# Patient Record
Sex: Male | Born: 1945 | Race: White | Hispanic: No | Marital: Married | Smoking: Former smoker
Health system: Southern US, Community
[De-identification: ages and names within clinical notes are randomized; demographics above are authoritative.]

## PROBLEM LIST (undated history)

## (undated) DIAGNOSIS — J449 Chronic obstructive pulmonary disease, unspecified: Secondary | ICD-10-CM

## (undated) DIAGNOSIS — J309 Allergic rhinitis, unspecified: Secondary | ICD-10-CM

## (undated) DIAGNOSIS — R7302 Impaired glucose tolerance (oral): Secondary | ICD-10-CM

## (undated) DIAGNOSIS — J438 Other emphysema: Secondary | ICD-10-CM

## (undated) DIAGNOSIS — K219 Gastro-esophageal reflux disease without esophagitis: Secondary | ICD-10-CM

## (undated) DIAGNOSIS — E785 Hyperlipidemia, unspecified: Secondary | ICD-10-CM

## (undated) DIAGNOSIS — I2510 Atherosclerotic heart disease of native coronary artery without angina pectoris: Secondary | ICD-10-CM

## (undated) DIAGNOSIS — S2249XA Multiple fractures of ribs, unspecified side, initial encounter for closed fracture: Secondary | ICD-10-CM

## (undated) HISTORY — DX: Other emphysema: J43.8

## (undated) HISTORY — DX: Multiple fractures of ribs, unspecified side, initial encounter for closed fracture: S22.49XA

## (undated) HISTORY — PX: APPENDECTOMY: SHX54

## (undated) HISTORY — DX: Allergic rhinitis, unspecified: J30.9

## (undated) HISTORY — DX: Chronic obstructive pulmonary disease, unspecified: J44.9

## (undated) HISTORY — DX: Gastro-esophageal reflux disease without esophagitis: K21.9

## (undated) HISTORY — DX: Hyperlipidemia, unspecified: E78.5

## (undated) HISTORY — DX: Impaired glucose tolerance (oral): R73.02

## (undated) HISTORY — DX: Atherosclerotic heart disease of native coronary artery without angina pectoris: I25.10

## (undated) HISTORY — PX: HEMORRHOID SURGERY: SHX153

---

## 1999-08-06 ENCOUNTER — Encounter: Payer: Self-pay | Admitting: Internal Medicine

## 1999-08-06 ENCOUNTER — Ambulatory Visit (HOSPITAL_COMMUNITY): Admission: RE | Admit: 1999-08-06 | Discharge: 1999-08-06 | Payer: Self-pay | Admitting: Internal Medicine

## 1999-08-17 ENCOUNTER — Ambulatory Visit (HOSPITAL_COMMUNITY): Admission: RE | Admit: 1999-08-17 | Discharge: 1999-08-17 | Payer: Self-pay | Admitting: *Deleted

## 1999-09-10 ENCOUNTER — Ambulatory Visit: Admission: RE | Admit: 1999-09-10 | Discharge: 1999-09-10 | Payer: Self-pay | Admitting: Internal Medicine

## 2000-02-17 DIAGNOSIS — S2249XA Multiple fractures of ribs, unspecified side, initial encounter for closed fracture: Secondary | ICD-10-CM

## 2000-02-17 HISTORY — DX: Multiple fractures of ribs, unspecified side, initial encounter for closed fracture: S22.49XA

## 2002-01-04 ENCOUNTER — Ambulatory Visit: Admission: RE | Admit: 2002-01-04 | Discharge: 2002-01-04 | Payer: Self-pay | Admitting: Pulmonary Disease

## 2002-01-18 ENCOUNTER — Ambulatory Visit: Admission: RE | Admit: 2002-01-18 | Discharge: 2002-01-18 | Payer: Self-pay | Admitting: Pulmonary Disease

## 2005-02-17 ENCOUNTER — Ambulatory Visit: Payer: Self-pay | Admitting: Pulmonary Disease

## 2005-03-24 ENCOUNTER — Ambulatory Visit: Payer: Self-pay | Admitting: Pulmonary Disease

## 2005-06-20 ENCOUNTER — Ambulatory Visit: Payer: Self-pay | Admitting: Pulmonary Disease

## 2005-06-27 ENCOUNTER — Ambulatory Visit: Payer: Self-pay | Admitting: Pulmonary Disease

## 2005-10-31 ENCOUNTER — Ambulatory Visit: Payer: Self-pay | Admitting: Pulmonary Disease

## 2005-11-07 ENCOUNTER — Ambulatory Visit: Payer: Self-pay | Admitting: Pulmonary Disease

## 2006-11-06 ENCOUNTER — Ambulatory Visit: Payer: Self-pay | Admitting: Pulmonary Disease

## 2007-03-16 ENCOUNTER — Ambulatory Visit: Payer: Self-pay | Admitting: Internal Medicine

## 2007-09-14 ENCOUNTER — Ambulatory Visit: Payer: Self-pay | Admitting: Internal Medicine

## 2007-10-29 DIAGNOSIS — J449 Chronic obstructive pulmonary disease, unspecified: Secondary | ICD-10-CM

## 2007-10-29 DIAGNOSIS — J438 Other emphysema: Secondary | ICD-10-CM

## 2007-10-29 HISTORY — DX: Other emphysema: J43.8

## 2008-04-09 ENCOUNTER — Ambulatory Visit: Payer: Self-pay | Admitting: Internal Medicine

## 2008-04-09 DIAGNOSIS — J209 Acute bronchitis, unspecified: Secondary | ICD-10-CM

## 2008-05-20 ENCOUNTER — Encounter: Payer: Self-pay | Admitting: Internal Medicine

## 2008-05-26 ENCOUNTER — Ambulatory Visit: Payer: Self-pay | Admitting: Internal Medicine

## 2008-05-26 DIAGNOSIS — J309 Allergic rhinitis, unspecified: Secondary | ICD-10-CM

## 2008-05-26 DIAGNOSIS — R29810 Facial weakness: Secondary | ICD-10-CM | POA: Insufficient documentation

## 2008-05-26 DIAGNOSIS — K219 Gastro-esophageal reflux disease without esophagitis: Secondary | ICD-10-CM | POA: Insufficient documentation

## 2008-05-26 DIAGNOSIS — I2510 Atherosclerotic heart disease of native coronary artery without angina pectoris: Secondary | ICD-10-CM | POA: Insufficient documentation

## 2008-05-26 HISTORY — DX: Atherosclerotic heart disease of native coronary artery without angina pectoris: I25.10

## 2008-05-26 HISTORY — DX: Allergic rhinitis, unspecified: J30.9

## 2008-05-26 HISTORY — DX: Gastro-esophageal reflux disease without esophagitis: K21.9

## 2008-05-26 LAB — CONVERTED CEMR LAB: PSA: 0.94 ng/mL (ref 0.10–4.00)

## 2008-05-28 ENCOUNTER — Telehealth (INDEPENDENT_AMBULATORY_CARE_PROVIDER_SITE_OTHER): Payer: Self-pay | Admitting: *Deleted

## 2008-06-03 ENCOUNTER — Encounter: Admission: RE | Admit: 2008-06-03 | Discharge: 2008-06-03 | Payer: Self-pay | Admitting: Internal Medicine

## 2008-06-24 ENCOUNTER — Telehealth (INDEPENDENT_AMBULATORY_CARE_PROVIDER_SITE_OTHER): Payer: Self-pay | Admitting: *Deleted

## 2008-06-24 ENCOUNTER — Ambulatory Visit: Payer: Self-pay | Admitting: Endocrinology

## 2008-06-24 DIAGNOSIS — R05 Cough: Secondary | ICD-10-CM

## 2008-12-17 ENCOUNTER — Ambulatory Visit: Payer: Self-pay | Admitting: Internal Medicine

## 2009-01-15 ENCOUNTER — Telehealth (INDEPENDENT_AMBULATORY_CARE_PROVIDER_SITE_OTHER): Payer: Self-pay | Admitting: *Deleted

## 2009-04-16 ENCOUNTER — Ambulatory Visit: Payer: Self-pay | Admitting: Internal Medicine

## 2009-06-29 ENCOUNTER — Ambulatory Visit: Payer: Self-pay | Admitting: Internal Medicine

## 2009-08-10 ENCOUNTER — Ambulatory Visit: Payer: Self-pay | Admitting: Internal Medicine

## 2010-07-29 ENCOUNTER — Ambulatory Visit: Payer: Self-pay | Admitting: Internal Medicine

## 2011-01-18 NOTE — Assessment & Plan Note (Signed)
Summary: Pumonary/ ext yearly f/u ov with HFA 90% p coaching   Copy to:  Rubye Oaks NP Primary Provider/Referring Provider:  Dr. Jonny Ruiz  CC:  Followup.  Pt states that overall breathing well.  Has had some trouble with the hot weather- tried to stay indoors.  He states throat has felt scratchy x 1 wk- had prod cough this am with clear sputum.Marland Kitchen  History of Present Illness: 89   yowm quit smoking 1992  with COPD FEV1 42% 03/24/05  December 17, 2008--presents for an acute office visit. Complains of 4 weeks of waxing and waning  days of nasal congestion, sore throat, drainage, cough, thick yellow-green mucus, and dyspnea. symptoms worsened 1 week ago. using mucinex.  rx with augmentin and prednisone  April 16, 2009 ov  back to baseline after exac 12/09 = doe chicken pens to house has to stop half way most improvement from advair, none from spiriva and has combivent but not using. no cough, am or otherwise. rec stop spiriva  June 29, 2009 ov with pft's no improvment doe and very hoarse, lots of throat clearing, no excess mucus imp was upper airway instability ? advair irriating   rec Stop advair Start Symbicort 160 2 puffs first thing  in am and 2 puffs again in pm about 12 hours later   August 10, 2009 no real change but noted today could go from pen to house without stopping, no nocturnal or am exac.  rec work on technique and add  acid rx if hoarsenss  July 29, 2010 Followup.  Pt states that overall breathing well.  Has had some trouble with the hot weather- tried to stay indoors.  He states throat has felt scratchy x 1 wk- had prod cough this am with clear sputum. no limiting sob.  Pt denies any significant sore throat, dysphagia, itching, sneezing,  nasal congestion or excess secretions,  fever, chills, sweats, unintended wt loss, pleuritic or exertional cp, hempoptysis, change in activity tolerance  orthopnea pnd or leg swelling. Pt also denies any obvious fluctuation in symptoms with weather  or environmental change or other alleviating or aggravating factors.          Current Medications (verified): 1)  Symbicort 160-4.5 Mcg/act  Aero (Budesonide-Formoterol Fumarate) .... 2 Puffs First Thing  in Am and 2 Puffs Again in Pm About 12 Hours Later 2)  Adult Aspirin Low Strength 81 Mg  Tbdp (Aspirin) .... Take 1 Tablet By Mouth Once A Day 3)  Multivitamins   Tabs (Multiple Vitamin) .... Take 1 Tablet By Mouth Once A Day 4)  B Complex 100  Tabs (B Complex Vitamins) .... Take 1 Tablet By Mouth Once A Day 5)  Bee Pollen 500 Mg Tabs (Bee Pollen) .Marland Kitchen.. 1 Once Daily 6)  Pepcid Ac 10 Mg  Tabs (Famotidine) .... Take 1 Tablet By Mouth Once Daily As Needed 7)  Mucinex 600 Mg Xr12h-Tab (Guaifenesin) .Marland Kitchen.. 1-2 Tablets Every 12 Hours As Needed 8)  Combivent 103-18 Mcg/act  Aero (Ipratropium-Albuterol) .... 2  Puffs Every 4 Hours As Needed 9)  Aleve 220 Mg Tabs (Naproxen Sodium) .... As Needed 10)  Zyrtec Allergy 10 Mg Tabs (Cetirizine Hcl) .Marland Kitchen.. 1 Once Daily As Needed  Allergies (verified): 1)  ! * Decongestants  Past History:  Past Medical History: EMPHYSEMA (ICD-492.8) CHRONIC OBSTRUCTIVE PULMONARY DISEASE, MODERATE (ICD-496)    - PFT's 03/24/05 FEV1 1.37 ratio 42,   21% improvment in FVC after B2, DLC0 80    - PFT's 06/29/09  1.19 ratio 29    6 % better after B2,   DLC0 73    - HFA 75% June 29, 2009 > 90 % August 10, 2009  GERD Coronary artery disease - minor by cath 2000 Allergic rhinitis Multiple rib fxs 3/20011  Vital Signs:  Patient profile:   65 year old male Weight:      185.13 pounds BMI:     26.66 O2 Sat:      93 % on Room air Temp:     98.0 degrees F oral Pulse rate:   78 / minute BP sitting:   122 / 80  (left arm)  Vitals Entered By: Vernie Murders (July 29, 2010 11:43 AM)  O2 Flow:  Room air  Physical Exam  Additional Exam:  amb wm nad minimally hoarse  wt  194 April 16, 2009 > 188 June 29, 2009   > 192 August 10, 2009  > 185 July 29, 2010  HEENT mild  turbinate edema.  Oropharynx no thrush or excess pnd or cobblestoning.  No JVD or cervical adenopathy. Mild accessory muscle hypertrophy. Trachea midline, nl thryroid. Chest was hyperinflated by percussion with diminished breath sounds and moderate increased exp time without wheeze. Hoover sign positive at mid inspiration. Regular rate and rhythm without murmur gallop or rub or increase P2 or edema.  Abd: no hsm, nl excursion. Ext warm without cyanosis or clubbing.     CXR  Procedure date:  07/29/2010  Findings:      The lungs appear hyperinflated consistent with obstructive pulmonary disease. No acute superimposed process is identified.   There is a healed fracture of the right ninth rib.   No left rib fractures were seen on the prior study of 2009.  On the current study there is evidence of interval fractures of the left sixth, seventh, and eighth ribs with adjacent pleural thickening.    Impression & Recommendations:  Problem # 1:  COPD UNSPECIFIED (ICD-496) GOLD IV s/p remote smoking cessation and well compensated with minimal need for combivent so no need to change rx  I spent extra time with the patient today explaining optimal mdi  technique.  This improved from  75-90% effective   Each maintenance medication was reviewed in detail including most importantly the difference between maintenance and as needed and under what circumstances the prns are to be used.   Needs Pneumovax @ 65  Problem # 2:  COUGH (ICD-786.2)  Explained natural h/o uri and why it's necessary in patients at risk to rx short term with PPI to reduce risk of evolving cyclical cough triggered by epithelial injury and a heightened sensitivty to the effects of any upper airway irritants,  most importantly acid - related. try diet first, f/u if not back to baseline after apparent uri, which he says is improving on its own with no signs of bacterial superinfection.      Orders: Est. Patient Level IV  (16109)  Medications Added to Medication List This Visit: 1)  Symbicort 160-4.5 Mcg/act Aero (Budesonide-formoterol fumarate) .... 2 puffs first thing  in am and 2 puffs again in pm about 12 hours later  Other Orders: T-2 View CXR ([id number])  Patient Instructions: 1)  Work on perfecting  inhaler technique:  relax and blow all the way out then take a nice smooth deep breath back in, triggering the inhaler at same time you start breathing in  and hold a few seconds and then rinse and gargle 2)  GERD (REFLUX)  is a common cause of respiratory symptoms. It commonly presents without heartburn and can be treated with medication, but also with lifestyle changes including avoidance of late meals, excessive alcohol, smoking cessation, and avoid fatty foods, chocolate, peppermint, colas, red wine, and acidic juices such as orange juice. NO MINT OR MENTHOL PRODUCTS SO NO COUGH DROPS  3)  USE SUGARLESS CANDY INSTEAD (jolley ranchers)  4)  NO OIL BASED VITAMINS  5)  If your breathing worsens or you need to use your rescue inhaler more than four times a week  or wake up more than twice a month with any respiratory symptoms or require more than two rescue inhalers per year, we need to see you right away 6)  Needs Pneumovax @ 65 7)    Prescriptions: COMBIVENT 103-18 MCG/ACT  AERO (IPRATROPIUM-ALBUTEROL) 2  puffs every 4 hours as needed  #1 x 11   Entered and Authorized by:   Nyoka Cowden MD   Signed by:   Nyoka Cowden MD on 07/29/2010   Method used:   Print then Give to Patient   RxID:   [id number] SYMBICORT 160-4.5 MCG/ACT  AERO (BUDESONIDE-FORMOTEROL FUMARATE) 2 puffs first thing  in am and 2 puffs again in pm about 12 hours later  #1 x 11   Entered and Authorized by:   Nyoka Cowden MD   Signed by:   Nyoka Cowden MD on 07/29/2010   Method used:   Print then Give to Patient   RxID:   [id number]

## 2011-03-18 ENCOUNTER — Other Ambulatory Visit (INDEPENDENT_AMBULATORY_CARE_PROVIDER_SITE_OTHER): Payer: Medicare Other

## 2011-03-18 ENCOUNTER — Encounter: Payer: Self-pay | Admitting: Endocrinology

## 2011-03-18 ENCOUNTER — Ambulatory Visit (INDEPENDENT_AMBULATORY_CARE_PROVIDER_SITE_OTHER): Payer: Medicare Other | Admitting: Endocrinology

## 2011-03-18 VITALS — BP 102/64 | HR 104 | Temp 98.6°F | Ht 71.0 in | Wt 180.0 lb

## 2011-03-18 DIAGNOSIS — R42 Dizziness and giddiness: Secondary | ICD-10-CM

## 2011-03-18 DIAGNOSIS — R5381 Other malaise: Secondary | ICD-10-CM

## 2011-03-18 DIAGNOSIS — R51 Headache: Secondary | ICD-10-CM | POA: Insufficient documentation

## 2011-03-18 DIAGNOSIS — R519 Headache, unspecified: Secondary | ICD-10-CM | POA: Insufficient documentation

## 2011-03-18 LAB — BASIC METABOLIC PANEL
CO2: 30 mEq/L (ref 19–32)
Calcium: 9.4 mg/dL (ref 8.4–10.5)
GFR: 103.1 mL/min (ref >60.00)
Potassium: 4.1 mEq/L (ref 3.5–5.1)
Sodium: 139 mEq/L (ref 135–145)

## 2011-03-18 LAB — CBC WITH DIFFERENTIAL/PLATELET
Basophils Absolute: 0.1 10*3/uL (ref 0.0–0.1)
HCT: 42 % (ref 39.0–52.0)
Lymphs Abs: 3 10*3/uL (ref 0.7–4.0)
Monocytes Relative: 8.9 % (ref 3.0–12.0)
Platelets: 249 10*3/uL (ref 150.0–400.0)
RDW: 13.7 % (ref 11.5–14.6)

## 2011-03-18 LAB — TSH: TSH: 1.7 u[IU]/mL (ref 0.35–5.50)

## 2011-03-18 NOTE — Patient Instructions (Addendum)
blood tests, and a ct scan are being ordered for you today.  please call [phone] to hear your test results. You should go to the emergency room if these symptoms happen again.   For the next 3 days, avoid strenuous activity. (update: i left message on phone-tree:  rx as we discussed)

## 2011-03-18 NOTE — Progress Notes (Signed)
  Subjective:    Patient ID: Kirk Anderson, male    DOB: Mar 25, 1946, 65 y.o.   MRN: [id number]  HPI 2 days ago, he was doing yard work.  He had sudden-onset moderate nausea, dizziness, generalized weakness, and headache generalized throughout the head.  These sxs lasted x 1 1/2 days, then resolved, without rx.  He has never had anything like this before.  No assoc numbness. Past Medical History  Diagnosis Date  . EMPHYSEMA 10/29/2007  . CORONARY ARTERY DISEASE 05/26/2008    minor by cath 2000  . ALLERGIC RHINITIS 05/26/2008  . GERD 05/26/2008  . COPD (chronic obstructive pulmonary disease)     PFT's 03/24/05 FEV1 1.37 ratio 42, 21% improvement in FVC after B2, DLCO 80 -PFT's 06/29/09 1.19 ratio 29 6% better after B2, DLC0 73 -HFA 75% 06/29/2009 >90% 08/10/2009  . Multiple rib fractures 02/2000   Past Surgical History  Procedure Date  . Appendectomy   . Hemorrhoid surgery     reports that he quit smoking about 20 years ago. His smoking use included Cigarettes. He has a 60 pack-year smoking history. He uses smokeless tobacco. His alcohol and drug histories not on file. family history includes Alcohol abuse in his brother; Aneurysm in his mother; and Heart disease in his father. Allergies  Allergen Reactions  . Sudafed (Pseudoephedrine Hcl)     Pt has an adverse reaction to any decongestants    Review of Systems No loc.  No visual loss.      Objective:   Physical Exam GENERAL: no distress HEAD: no deformity eyes: no periorbital swelling, no proptosis external nose and ears are normal mouth: no lesion seen Neck:  Supple.  No goiter LUNGS: Clear to a HEART:Regular rate and rhythm without murmurs noted. Normal S1,S2.   MSK: muscle bulk and strength are grossly normal.  no obvious joint swelling.  gait is normal and steady NEURO:  Cn are grossly intact bilaterally. sensation is intact to touch on the feet. SKIN: normal texture and temp.  no rash.  not diaphoretic.    Assessment &  Plan:  Dizziness, uncertain etiology.  New problem.

## 2011-03-31 ENCOUNTER — Ambulatory Visit (INDEPENDENT_AMBULATORY_CARE_PROVIDER_SITE_OTHER)
Admission: RE | Admit: 2011-03-31 | Discharge: 2011-03-31 | Disposition: A | Discharge status: Home / Self Care | Payer: Medicare Other | Source: Ambulatory Visit | Attending: Endocrinology | Admitting: Endocrinology

## 2011-03-31 DIAGNOSIS — R42 Dizziness and giddiness: Secondary | ICD-10-CM

## 2011-04-08 ENCOUNTER — Telehealth: Payer: Self-pay | Admitting: Internal Medicine

## 2011-04-08 NOTE — Telephone Encounter (Signed)
Pt states he has not yet received results of his cat scan done 4/12. Please let pt know results.

## 2011-04-08 NOTE — Telephone Encounter (Signed)
Called patient informed Dr. Everardo All left message on the phone tree.

## 2011-08-19 ENCOUNTER — Other Ambulatory Visit: Payer: Self-pay | Admitting: *Deleted

## 2011-08-19 MED ORDER — BUDESONIDE-FORMOTEROL FUMARATE 160-4.5 MCG/ACT IN AERO: 2.0000 | INHALATION_SPRAY | Freq: Two times a day (BID) | RESPIRATORY_TRACT | Status: DC

## 2011-08-26 ENCOUNTER — Ambulatory Visit (INDEPENDENT_AMBULATORY_CARE_PROVIDER_SITE_OTHER): Payer: Medicare Other | Admitting: Internal Medicine

## 2011-08-26 ENCOUNTER — Encounter: Payer: Self-pay | Admitting: Internal Medicine

## 2011-08-26 VITALS — BP 142/80 | HR 98 | Temp 98.6°F | Ht 70.0 in | Wt 183.8 lb

## 2011-08-26 DIAGNOSIS — J449 Chronic obstructive pulmonary disease, unspecified: Secondary | ICD-10-CM

## 2011-08-26 MED ORDER — TIOTROPIUM BROMIDE MONOHYDRATE 18 MCG IN CAPS: 18.0000 ug | ORAL_CAPSULE | Freq: Every day | RESPIRATORY_TRACT | Status: DC

## 2011-08-26 NOTE — Patient Instructions (Addendum)
Start spiriva one capsule each am  You should see gradual improvement in your breathing and much less need for combivent  Please schedule a follow up office visit in 6 weeks, call sooner if needed with PFT's  Add needs cxr and alpha one on next ov if not done

## 2011-08-26 NOTE — Progress Notes (Signed)
Subjective:     Patient ID: Kirk Anderson, male   DOB: [birth year], 65 y.o.   MRN: [id number]  HPI   65 yowm quit smoking 1992 with COPD FEV1 42% 03/24/05  June 29, 2009 ov with pft's no improvment doe and very hoarse, lots of throat clearing, no excess mucus imp was upper airway instability ? advair irriating rec Stop advair  Start Symbicort 160 2 puffs first thing in am and 2 puffs again in pm about 12 hours later   August 10, 2009 no real change but noted today could go from pen to house without stopping, no nocturnal or am exac.  rec work on technique and add acid rx if hoarsenss   08/26/2011 f/u ov/Kirk Anderson cc doe x large aisle like [employer],  freq use of combivent when humid.  Sleeping ok without nocturnal  or early am exac of resp c/o's or need for noct saba.    Pt denies any significant sore throat, dysphagia, itching, sneezing, nasal congestion or excess secretions, fever, chills, sweats, unintended wt loss, pleuritic or exertional cp, hempoptysis, change in activity tolerance orthopnea pnd or leg swelling. Pt also denies any obvious fluctuation in symptoms with weather or environmental change or other alleviating or aggravating factors.     Allergies  1) ! * Decongestants    Past Medical History:  EMPHYSEMA (ICD-492.8)  CHRONIC OBSTRUCTIVE PULMONARY DISEASE, MODERATE (ICD-496)  - PFT's 03/24/05 FEV1 1.37 ratio 42, 21% improvment in FVC after B2, DLC0 80  - PFT's 06/29/09 1.19 ratio 29 6 % better after B2, DLC0 73  - HFA 75% June 29, 2009 > 90 % August 10, 2009  GERD  Coronary artery disease - minor by cath 2000  Allergic rhinitis  Multiple rib fxs 3/20011    Review of Systems     Objective:   Physical Exam     amb wm nad minimally hoarse - edentulous wt 194 April 16, 2009 > 188 June 29, 2009 > 192 August 10, 2009 > 185 July 29, 2010 > 183 08/26/2011  HEENT mild turbinate edema. Oropharynx no thrush or excess pnd or cobblestoning. No JVD or cervical adenopathy. Mild  accessory muscle hypertrophy. Trachea midline, nl thryroid. Chest was hyperinflated by percussion with diminished breath sounds and moderate increased exp time without wheeze. Hoover sign positive at mid inspiration. Regular rate and rhythm without murmur gallop or rub or increase P2 or edema. Abd: no hsm, nl excursion. Ext warm without cyanosis or clubbing. Assessment:         Plan:

## 2011-08-27 ENCOUNTER — Encounter: Payer: Self-pay | Admitting: Internal Medicine

## 2011-08-27 NOTE — Assessment & Plan Note (Addendum)
DDX of  difficult airways managment all start with A and  include Adherence, Ace Inhibitors, Acid Reflux, Active Sinus Disease, Alpha 1 Antitripsin deficiency, Anxiety masquerading as Airways dz,  ABPA,  allergy(esp in young), Aspiration (esp in elderly), Adverse effects of DPI,  Active smokers, plus two Bs  = Bronchiectasis and Beta blocker use..and one C= CHF  Adherence is always the initial "prime suspect" and is a multilayered concern that requires a "trust but verify" approach in every patient - starting with knowing how to use medications, especially inhalers, correctly, keeping up with refills and understanding the fundamental difference between maintenance and prns vs those medications only taken for a very short course and then stopped and not refilled.   Each maintenance medication was reviewed in detail including most importantly the difference between maintenance and as needed and under what circumstances the prns are to be used.  Please see instructions for details which were reviewed in writing and the patient given a copy.   ? Acid reflux> not being treated aggressively at present, denies overt HB but this does not exclude GERD/LPR in 75% of pts who have it  ? Alpha one ever tested > needs genotype to be complete  ? Active sinus dz >  Consider sinus ct later    Still with significant limitation and has not yet tried combination of spiriva and symbicort (has tol dpi poorly in past due to hoarseness   The proper method of use, as well as anticipated side effects, of this Dry powder nhaler are discussed and demonstrated to the patient.  Improved to 90% with coaching - consider more aggressive rx for gerd if upper airway symptoms worsen on spiriva if activity tolerance improves - if not probably should avoid dpi indefinitely.   See instructions for specific recommendations which were reviewed directly with the patient who was given a copy with highlighter outlining the key components.

## 2011-10-07 ENCOUNTER — Encounter: Payer: Self-pay | Admitting: Internal Medicine

## 2011-10-07 ENCOUNTER — Ambulatory Visit (INDEPENDENT_AMBULATORY_CARE_PROVIDER_SITE_OTHER)
Admission: RE | Admit: 2011-10-07 | Discharge: 2011-10-07 | Disposition: A | Discharge status: Home / Self Care | Payer: Medicare Other | Source: Ambulatory Visit | Attending: Internal Medicine | Admitting: Internal Medicine

## 2011-10-07 ENCOUNTER — Ambulatory Visit (INDEPENDENT_AMBULATORY_CARE_PROVIDER_SITE_OTHER): Payer: Medicare Other | Admitting: Internal Medicine

## 2011-10-07 VITALS — BP 138/78 | HR 85 | Ht 70.0 in | Wt 183.0 lb

## 2011-10-07 DIAGNOSIS — J449 Chronic obstructive pulmonary disease, unspecified: Secondary | ICD-10-CM

## 2011-10-07 LAB — PULMONARY FUNCTION TEST

## 2011-10-07 MED ORDER — TIOTROPIUM BROMIDE MONOHYDRATE 18 MCG IN CAPS: 18.0000 ug | ORAL_CAPSULE | Freq: Every day | RESPIRATORY_TRACT | Status: DC

## 2011-10-07 MED ORDER — BUDESONIDE-FORMOTEROL FUMARATE 160-4.5 MCG/ACT IN AERO: 2.0000 | INHALATION_SPRAY | Freq: Two times a day (BID) | RESPIRATORY_TRACT | Status: DC

## 2011-10-07 NOTE — Progress Notes (Signed)
PFT done today. 

## 2011-10-07 NOTE — Patient Instructions (Signed)
Please remember to go to the  x-ray department downstairs for your tests - we will call you with the results when they are available.  Try just use symbicort 160 2 puffs every 12 hours if needed  Please schedule a follow up office visit in 6 months, call sooner if needed

## 2011-10-07 NOTE — Assessment & Plan Note (Signed)
-   PFT's 03/24/05   FEV1 1.37 ratio 42, 21% improvment in FVC after B2, DLC0 80  - PFT's 06/29/09 FEV1 1.19 ratio 29,   6 % better after B2, DLC0 73  - PFT's 10/07/2011  1.33 (43%) ratio 30 no better 82% so GOLD III - HFA 75% June 29, 2009 > 90 % August 10, 2009  - Alpha one genotype  10/07/2011 >>>  Pos response to addition of spiriva and enough variability to suggest asthmatic component > well compensated on present rx.     Each maintenance medication was reviewed in detail including most importantly the difference between maintenance and as needed and under what circumstances the prns are to be used.  Please see instructions for details which were reviewed in writing and the patient given a copy.

## 2011-10-07 NOTE — Progress Notes (Signed)
Subjective:     Patient ID: Kirk Anderson, male   DOB: May 04, 1946, 65 y.o.   MRN: [id number]  HPI   65 yowm quit smoking 1992 with COPD FEV1 42% 03/24/05  June 29, 2009 ov with pft's no improvment doe and very hoarse, lots of throat clearing, no excess mucus imp was upper airway instability ? advair irriating rec Stop advair  Start Symbicort 160 2 puffs first thing in am and 2 puffs again in pm about 12 hours later   August 10, 2009 no real change but noted today could go from pen to house without stopping, no nocturnal or am exac.  rec work on technique and add acid rx if hoarsenss   08/26/2011 f/u ov/Kirk Anderson cc doe x large aisle like [employer],  freq use of combivent when humid. rec Start spiriva one capsule each am  You should see gradual improvement in your breathing and much less need for combivent  Please schedule a follow up office visit in 6 weeks, call sooner if needed with PFT's  Add needs cxr and alpha one on next ov if not done    10/07/2011 f/u ov/Kirk Anderson cc breathing much better, no longer needing combivent at all attributed to cooler weather but only happened p rx with spiriva added to symbicort.  Sleeping ok without nocturnal  or early am exac of resp c/o's or need for noct saba.    Pt denies any significant sore throat, dysphagia, itching, sneezing, nasal congestion or excess secretions, fever, chills, sweats, unintended wt loss, pleuritic or exertional cp, hempoptysis, change in activity tolerance orthopnea pnd or leg swelling. Pt also denies any obvious fluctuation in symptoms with weather or environmental change or other alleviating or aggravating factors.     Allergies  1) ! * Decongestants    Past Medical History:  EMPHYSEMA (ICD-492.8)  CHRONIC OBSTRUCTIVE PULMONARY DISEASE, MODERATE (ICD-496)  GERD  Coronary artery disease - minor by cath 2000  Allergic rhinitis  Multiple rib fxs 3/20011    Review of Systems     Objective:   Physical Exam     amb wm  nad minimally hoarse - edentulous  wt 194 April 16, 2009 > 188 June 29, 2009 >   183 08/26/2011 > 10/07/2011  183  HEENT mild turbinate edema. Oropharynx no thrush or excess pnd or cobblestoning. No JVD or cervical adenopathy. Mild accessory muscle hypertrophy. Trachea midline, nl thryroid. Chest was hyperinflated by percussion with diminished breath sounds and moderate increased exp time without wheeze. Hoover sign positive at mid inspiration. Regular rate and rhythm without murmur gallop or rub or increase P2 or edema. Abd: no hsm, nl excursion. Ext warm without cyanosis or clubbing.  CXR  10/07/2011 :  COPD, without acute superimposed process.   Assessment:         Plan:

## 2011-10-08 ENCOUNTER — Encounter: Payer: Self-pay | Admitting: Internal Medicine

## 2011-10-12 NOTE — Progress Notes (Signed)
Quick Note:  Spoke with pt and notified of results per Dr. Wert. Pt verbalized understanding and denied any questions.  ______ 

## 2011-10-25 ENCOUNTER — Encounter: Payer: Self-pay | Admitting: Internal Medicine

## 2011-10-25 ENCOUNTER — Telehealth: Payer: Self-pay | Admitting: Internal Medicine

## 2011-10-25 NOTE — Telephone Encounter (Signed)
Per [facility] Alpha 1 test was negative. I called and spoke with pt and notified of this and he verbalized understanding and denied any questions.

## 2011-10-28 ENCOUNTER — Encounter: Payer: Self-pay | Admitting: Internal Medicine

## 2011-11-06 ENCOUNTER — Encounter: Payer: Self-pay | Admitting: Internal Medicine

## 2011-11-07 ENCOUNTER — Telehealth: Payer: Self-pay | Admitting: Internal Medicine

## 2011-11-07 NOTE — Telephone Encounter (Signed)
Alpha 1 was neg- Need to just inform the pt of this. LMTCB

## 2011-11-14 NOTE — Telephone Encounter (Signed)
Spoke with pt and notified of results per Dr. Wert. Pt verbalized understanding and denied any questions. 

## 2012-04-11 ENCOUNTER — Encounter: Payer: Self-pay | Admitting: Internal Medicine

## 2012-04-11 ENCOUNTER — Ambulatory Visit (INDEPENDENT_AMBULATORY_CARE_PROVIDER_SITE_OTHER): Payer: Medicare Other | Admitting: Internal Medicine

## 2012-04-11 VITALS — BP 116/70 | HR 72 | Temp 97.7°F | Ht 70.0 in | Wt 180.0 lb

## 2012-04-11 DIAGNOSIS — J449 Chronic obstructive pulmonary disease, unspecified: Secondary | ICD-10-CM

## 2012-04-11 NOTE — Progress Notes (Signed)
Subjective:     Patient ID: Kirk Anderson, male   DOB: 1946/05/18   MRN: [id number]  HPI   66yowm quit smoking 1992 with COPD FEV1 42% 03/24/05  June 29, 2009 ov with pft's no improvment doe and very hoarse, lots of throat clearing, no excess mucus imp was upper airway instability ? advair irriating rec Stop advair  Start Symbicort 160 2 puffs first thing in am and 2 puffs again in pm about 12 hours later   August 10, 2009 no real change but noted today could go from pen to house without stopping, no nocturnal or am exac.  rec work on technique and add acid rx if hoarsenss   08/26/2011 f/u ov/Stepfon Rawles cc doe x large aisle like [employer],  freq use of combivent when humid. rec Start spiriva one capsule each am  You should see gradual improvement in your breathing and much less need for combivent     10/07/2011 f/u ov/Bernon Arviso cc breathing much better, no longer needing combivent at all attributed to cooler weather but only happened p rx with spiriva added to symbicort.  rec Try just use symbicort 160 2 puffs every 12 hours if needed    04/11/2012 f/u ov/Keltin Baird cc doe about the same, only with exertion,  on maint spiriva and sometimes symbicort, minimal mostly dry cough. No daytime saba need at all, no variabilty in symptoms but hasn't gotten hot yet so too soon to tell for sure. No real limiting sob with desired activities.  Sleeping ok without nocturnal  or early am exacerbation  of respiratory  c/o's or need for noct saba. Also denies any obvious fluctuation of symptoms with weather or environmental changes or other aggravating or alleviating factors except as outlined above    Pt denies any significant sore throat, dysphagia, itching, sneezing, nasal congestion or excess secretions, fever, chills, sweats, unintended wt loss, pleuritic or exertional cp, hempoptysis, change in activity tolerance orthopnea pnd or leg swelling. Pt also denies any obvious fluctuation in symptoms with weather or  environmental change or other alleviating or aggravating factors.     Allergies  1) ! * Decongestants     Past Medical History:  EMPHYSEMA (ICD-492.8)  CHRONIC OBSTRUCTIVE PULMONARY DISEASE, MODERATE (ICD-496)  GERD  Coronary artery disease - minor by cath 2000  Allergic rhinitis  Multiple rib fxs 3/20011          Objective:   Physical Exam   Amb wm nad minimally hoarse - edentulous  wt 194 April 16, 2009 > 188 June 29, 2009 >   183 08/26/2011 > 10/07/2011  183 > 04/11/2012  180  HEENT mild turbinate edema. Oropharynx no thrush or excess pnd or cobblestoning. No JVD or cervical adenopathy. Mild accessory muscle hypertrophy. Trachea midline, nl thryroid. Chest was hyperinflated by percussion with diminished breath sounds and moderate increased exp time without wheeze. Hoover sign positive at mid inspiration. Regular rate and rhythm without murmur gallop or rub or increase P2 or edema. Abd: no hsm, nl excursion. Ext warm without cyanosis or clubbing.  CXR  10/07/2011 :  COPD, without acute superimposed process.   Assessment:         Plan:

## 2012-04-11 NOTE — Patient Instructions (Signed)
No change in treatment plan   If you are satisfied with your treatment plan let your doctor know and he/she can either refill your medications or you can return here when your prescription runs out.     If in any way you are not 100% satisfied,  please tell us.  If 100% better, tell your friends!

## 2012-04-11 NOTE — Assessment & Plan Note (Signed)
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  The proper method of use, as well as anticipated side effects, of a metered-dose inhaler are discussed and demonstrated to the patient. Improved effectiveness after extensive coaching during this visit to a level of approximately  90%

## 2012-10-31 ENCOUNTER — Other Ambulatory Visit: Payer: Self-pay | Admitting: Internal Medicine

## 2012-12-22 ENCOUNTER — Other Ambulatory Visit: Payer: Self-pay | Admitting: Internal Medicine

## 2013-02-12 ENCOUNTER — Encounter: Payer: Self-pay | Admitting: Internal Medicine

## 2013-02-12 ENCOUNTER — Other Ambulatory Visit (INDEPENDENT_AMBULATORY_CARE_PROVIDER_SITE_OTHER): Payer: Medicare Other

## 2013-02-12 ENCOUNTER — Ambulatory Visit (INDEPENDENT_AMBULATORY_CARE_PROVIDER_SITE_OTHER): Payer: Medicare Other | Admitting: Internal Medicine

## 2013-02-12 ENCOUNTER — Other Ambulatory Visit: Payer: Self-pay | Admitting: Internal Medicine

## 2013-02-12 VITALS — BP 120/72 | HR 79 | Temp 97.6°F | Ht 70.0 in | Wt 187.4 lb

## 2013-02-12 DIAGNOSIS — I25810 Atherosclerosis of coronary artery bypass graft(s) without angina pectoris: Secondary | ICD-10-CM

## 2013-02-12 DIAGNOSIS — Z23 Encounter for immunization: Secondary | ICD-10-CM

## 2013-02-12 LAB — URINALYSIS, ROUTINE W REFLEX MICROSCOPIC
Bilirubin Urine: NEGATIVE
Ketones, ur: NEGATIVE
Leukocytes, UA: NEGATIVE
Specific Gravity, Urine: 1.015 (ref 1.000–1.030)
Total Protein, Urine: NEGATIVE
Urine Glucose: NEGATIVE
pH: 6 (ref 5.0–8.0)

## 2013-02-12 LAB — LDL CHOLESTEROL, DIRECT: Direct LDL: 126.9 mg/dL

## 2013-02-12 LAB — BASIC METABOLIC PANEL
BUN: 11 mg/dL (ref 6–23)
Calcium: 9.4 mg/dL (ref 8.4–10.5)
GFR: 76.57 mL/min (ref >60.00)
Glucose, Bld: 99 mg/dL (ref 70–99)
Potassium: 4.7 mEq/L (ref 3.5–5.1)
Sodium: 139 mEq/L (ref 135–145)

## 2013-02-12 LAB — CBC WITH DIFFERENTIAL/PLATELET
Basophils Relative: 0.4 % (ref 0.0–3.0)
Eosinophils Relative: 4.3 % (ref 0.0–5.0)
HCT: 44.6 % (ref 39.0–52.0)
Hemoglobin: 15.2 g/dL (ref 13.0–17.0)
Lymphs Abs: 3 10*3/uL (ref 0.7–4.0)
MCV: 88.2 fl (ref 78.0–100.0)
Monocytes Absolute: 0.5 10*3/uL (ref 0.1–1.0)
Monocytes Relative: 6.9 % (ref 3.0–12.0)
Neutro Abs: 3.9 10*3/uL (ref 1.4–7.7)
Platelets: 229 10*3/uL (ref 150.0–400.0)
WBC: 7.8 10*3/uL (ref 4.5–10.5)

## 2013-02-12 LAB — HEPATIC FUNCTION PANEL
AST: 24 U/L (ref 0–37)
Albumin: 4.3 g/dL (ref 3.5–5.2)
Total Bilirubin: 1.1 mg/dL (ref 0.3–1.2)

## 2013-02-12 LAB — LIPID PANEL
HDL: 46.5 mg/dL (ref >39.00)
Triglycerides: 337 mg/dL — ABNORMAL HIGH (ref 0.0–149.0)
VLDL: 67.4 mg/dL — ABNORMAL HIGH (ref 0.0–40.0)

## 2013-02-12 LAB — PSA: PSA: 2.29 ng/mL (ref 0.10–4.00)

## 2013-02-12 MED ORDER — TIOTROPIUM BROMIDE MONOHYDRATE 18 MCG IN CAPS: 18.0000 ug | ORAL_CAPSULE | Freq: Every day | RESPIRATORY_TRACT | Status: DC

## 2013-02-12 MED ORDER — ATORVASTATIN CALCIUM 10 MG PO TABS: 10.0000 mg | ORAL_TABLET | Freq: Every day | ORAL | Status: DC

## 2013-02-12 MED ORDER — BUDESONIDE-FORMOTEROL FUMARATE 160-4.5 MCG/ACT IN AERO: 2.0000 | INHALATION_SPRAY | Freq: Two times a day (BID) | RESPIRATORY_TRACT | Status: DC

## 2013-02-12 NOTE — Assessment & Plan Note (Signed)

## 2013-02-12 NOTE — Patient Instructions (Addendum)
You had the tetanus (Tdap) shot today, and the pneumonia shot Please continue all other medications as before, and refills have been done if requested. Please have the pharmacy call with any other refills you may need. Please continue your efforts at being more active, low cholesterol diet, and weight control. You will be contacted regarding the referral for: colonoscopy You are otherwise up to date with prevention measures today. Please go to the LAB in the Basement (turn left off the elevator) for the tests to be done today You will be contacted by phone if any changes need to be made immediately.  Otherwise, you will receive a letter about your results with an explanation, but please check with MyChart first. Thank you for enrolling in MyChart. Please follow the instructions below to securely access your online medical record. MyChart allows you to send messages to your doctor, view your test results, renew your prescriptions, schedule appointments, and more. To Log into My Chart online, please go by [employer] or [employer] to [email or web address], or download the MyChart App from the [employer] .  Your Username is: [email or web address] (pass [phone]) Please send a practice Message on Mychart later today. Please return in 1 year for your yearly visit, or sooner if needed

## 2013-02-12 NOTE — Progress Notes (Signed)
Subjective:    Patient ID: Kirk Anderson, male    DOB: 09-Jul-1946, 67 y.o.   MRN: [id number]  HPI  Here for wellness and f/u;  Overall doing ok;  Pt denies CP, worsening SOB, DOE, wheezing, orthopnea, PND, worsening LE edema, palpitations, dizziness or syncope.  Pt denies neurological change such as new headache, facial or extremity weakness.  Pt denies polydipsia, polyuria, or low sugar symptoms. Pt states overall good compliance with treatment and medications, good tolerability, and has been trying to follow lower cholesterol diet.  Pt denies worsening depressive symptoms, suicidal ideation or panic. No fever, night sweats, wt loss, loss of appetite, or other constitutional symptoms.  Pt states good ability with ADL's, has low fall risk, home safety reviewed and adequate, no other significant changes in hearing or vision, and only occasionally active with exercise. No acute complaints Past Medical History  Diagnosis Date  . EMPHYSEMA 10/29/2007  . CORONARY ARTERY DISEASE 05/26/2008    minor by cath 2000  . ALLERGIC RHINITIS 05/26/2008  . GERD 05/26/2008  . COPD (chronic obstructive pulmonary disease)     PFT's 03/24/05 FEV1 1.37 ratio 42, 21% improvement in FVC after B2, DLCO 80 -PFT's 06/29/09 1.19 ratio 29 6% better after B2, DLC0 73 -HFA 75% 06/29/2009 >90% 08/10/2009  . Multiple rib fractures 02/2000   Past Surgical History  Procedure Laterality Date  . Appendectomy    . Hemorrhoid surgery      reports that he quit smoking about 22 years ago. His smoking use included Cigarettes. He has a 60 pack-year smoking history. He uses smokeless tobacco. His alcohol and drug histories are not on file. family history includes Alcohol abuse in his brother; Aneurysm in his mother; and Heart disease in his father. Allergies  Allergen Reactions  . Sudafed (Pseudoephedrine Hcl)     Pt has an adverse reaction to any decongestants   Current Outpatient Prescriptions on File Prior to Visit  Medication Sig  Dispense Refill  . albuterol-ipratropium (COMBIVENT) 18-103 MCG/ACT inhaler Inhale 2 puffs into the lungs every 4 (four) hours as needed.        Marland Kitchen aspirin 81 MG tablet Take 81 mg by mouth daily.        . B Complex Vitamins (B COMPLEX 100 PO) Take 1 tablet by mouth daily.        Alphonsus Sias Pollen 500 MG TABS Take 1 tablet by mouth daily.        . cetirizine (ZYRTEC) 10 MG tablet Take 10 mg by mouth daily as needed.        . famotidine (PEPCID AC) 10 MG chewable tablet Chew 10 mg by mouth daily as needed.        Marland Kitchen guaiFENesin (MUCINEX) 600 MG 12 hr tablet 1-2 tablets every 12 hours as needed       . Multiple Vitamin (MULTIVITAMIN) tablet Take 1 tablet by mouth daily.        . naproxen sodium (ANAPROX) 220 MG tablet Take 220 mg by mouth. As needed       . budesonide-formoterol (SYMBICORT) 160-4.5 MCG/ACT inhaler Inhale 2 puffs into the lungs 2 (two) times daily as needed.       No current facility-administered medications on file prior to visit.   Review of Systems Constitutional: Negative for diaphoresis, activity change, appetite change or unexpected weight change.  HENT: Negative for hearing loss, ear pain, facial swelling, mouth sores and neck stiffness.   Eyes: Negative for pain, redness and visual disturbance.  Respiratory: Negative for shortness of breath and wheezing.   Cardiovascular: Negative for chest pain and palpitations.  Gastrointestinal: Negative for diarrhea, blood in stool, abdominal distention or other pain Genitourinary: Negative for hematuria, flank pain or change in urine volume.  Musculoskeletal: Negative for myalgias and joint swelling.  Skin: Negative for color change and wound.  Neurological: Negative for syncope and numbness. other than noted Hematological: Negative for adenopathy.  Psychiatric/Behavioral: Negative for hallucinations, self-injury, decreased concentration and agitation.      Objective:   Physical Exam BP 120/72  Pulse 79  Temp(Src) 97.6 F (36.4 C)  (Oral)  Ht 5\' 10"  (1.778 m)  Wt 187 lb 6 oz (84.993 kg)  BMI 26.89 kg/m2  SpO2 93% VS noted,  Constitutional: Pt is oriented to person, place, and time. Appears well-developed and well-nourished.  Head: Normocephalic and atraumatic.  Right Ear: External ear normal.  Left Ear: External ear normal.  Nose: Nose normal.  Mouth/Throat: Oropharynx is clear and moist.  Eyes: Conjunctivae and EOM are normal. Pupils are equal, round, and reactive to light.  Neck: Normal range of motion. Neck supple. No JVD present. No tracheal deviation present.  Cardiovascular: Normal rate, regular rhythm, normal heart sounds and intact distal pulses.   Pulmonary/Chest: Effort normal and breath sounds normal.  Abdominal: Soft. Bowel sounds are normal. There is no tenderness. No HSM  Musculoskeletal: Normal range of motion. Exhibits no edema.  Lymphadenopathy:  Has no cervical adenopathy.  Neurological: Pt is alert and oriented to person, place, and time. Pt has normal reflexes. No cranial nerve deficit.  Skin: Skin is warm and dry. No rash noted.  Psychiatric:  Has  normal mood and affect. Behavior is normal.     Assessment & Plan:

## 2013-02-14 ENCOUNTER — Encounter: Payer: Self-pay | Admitting: Internal Medicine

## 2013-02-21 ENCOUNTER — Encounter: Payer: Self-pay | Admitting: Internal Medicine

## 2013-02-21 ENCOUNTER — Other Ambulatory Visit: Payer: Self-pay | Admitting: Internal Medicine

## 2013-02-21 MED ORDER — PRAVASTATIN SODIUM 20 MG PO TABS: 20.0000 mg | ORAL_TABLET | Freq: Every day | ORAL | Status: DC

## 2013-02-26 ENCOUNTER — Encounter: Payer: Self-pay | Admitting: Internal Medicine

## 2013-03-13 ENCOUNTER — Encounter: Payer: Self-pay | Admitting: Internal Medicine

## 2013-04-02 ENCOUNTER — Encounter: Payer: Self-pay | Admitting: Internal Medicine

## 2013-04-02 ENCOUNTER — Ambulatory Visit (AMBULATORY_SURGERY_CENTER): Payer: Medicare Other | Admitting: *Deleted

## 2013-04-02 VITALS — Ht 70.0 in | Wt 186.8 lb

## 2013-04-02 DIAGNOSIS — Z1211 Encounter for screening for malignant neoplasm of colon: Secondary | ICD-10-CM

## 2013-04-02 MED ORDER — MOVIPREP 100 G PO SOLR: ORAL | Status: DC

## 2013-04-11 ENCOUNTER — Encounter: Payer: Self-pay | Admitting: Internal Medicine

## 2013-04-11 ENCOUNTER — Ambulatory Visit (AMBULATORY_SURGERY_CENTER): Payer: Medicare Other | Admitting: Internal Medicine

## 2013-04-11 VITALS — BP 114/70 | HR 63 | Temp 97.1°F | Resp 51 | Ht 70.0 in | Wt 186.0 lb

## 2013-04-11 DIAGNOSIS — Z1211 Encounter for screening for malignant neoplasm of colon: Secondary | ICD-10-CM

## 2013-04-11 MED ORDER — SODIUM CHLORIDE 0.9 % IV SOLN: 500.0000 mL | INTRAVENOUS | Status: DC

## 2013-04-11 NOTE — Progress Notes (Signed)
Patient did not experience any of the following events: a burn prior to discharge; a fall within the facility; wrong site/side/patient/procedure/implant event; or a hospital transfer or hospital admission upon discharge from the facility. (G8907) Patient did not have preoperative order for IV antibiotic SSI prophylaxis. (G8918)  

## 2013-04-11 NOTE — Patient Instructions (Addendum)
Findings:  Diverticulosis Recommendations:  Repeat colonoscopy in 10 years  YOU HAD AN ENDOSCOPIC PROCEDURE TODAY AT THE [facility]: Refer to the procedure report that was given to you for any specific questions about what was found during the examination.  If the procedure report does not answer your questions, please call your gastroenterologist to clarify.  If you requested that your care partner not be given the details of your procedure findings, then the procedure report has been included in a sealed envelope for you to review at your convenience later.  YOU SHOULD EXPECT: Some feelings of bloating in the abdomen. Passage of more gas than usual.  Walking can help get rid of the air that was put into your GI tract during the procedure and reduce the bloating. If you had a lower endoscopy (such as a colonoscopy or flexible sigmoidoscopy) you may notice spotting of blood in your stool or on the toilet paper. If you underwent a bowel prep for your procedure, then you may not have a normal bowel movement for a few days.  DIET: Your first meal following the procedure should be a light meal and then it is ok to progress to your normal diet.  A half-sandwich or bowl of soup is an example of a good first meal.  Heavy or fried foods are harder to digest and may make you feel nauseous or bloated.  Likewise meals heavy in dairy and vegetables can cause extra gas to form and this can also increase the bloating.  Drink plenty of fluids but you should avoid alcoholic beverages for 24 hours.  ACTIVITY: Your care partner should take you home directly after the procedure.  You should plan to take it easy, moving slowly for the rest of the day.  You can resume normal activity the day after the procedure however you should NOT DRIVE or use heavy machinery for 24 hours (because of the sedation medicines used during the test).    SYMPTOMS TO REPORT IMMEDIATELY: A gastroenterologist can be reached at any  hour.  During normal business hours, 8:30 AM to 5:00 PM Monday through Friday, call [phone].  After hours and on weekends, please call the GI answering service at [phone] who will take a message and have the physician on call contact you.   Following lower endoscopy (colonoscopy or flexible sigmoidoscopy):  Excessive amounts of blood in the stool  Significant tenderness or worsening of abdominal pains  Swelling of the abdomen that is new, acute  Fever of 100F or higher  Following upper endoscopy (EGD)  Vomiting of blood or coffee ground material  New chest pain or pain under the shoulder blades  Painful or persistently difficult swallowing  New shortness of breath  Fever of 100F or higher  Black, tarry-looking stools  FOLLOW UP: If any biopsies were taken you will be contacted by phone or by letter within the next 1-3 weeks.  Call your gastroenterologist if you have not heard about the biopsies in 3 weeks.  Our staff will call the home number listed on your records the next business day following your procedure to check on you and address any questions or concerns that you may have at that time regarding the information given to you following your procedure. This is a courtesy call and so if there is no answer at the home number and we have not heard from you through the emergency physician on call, we will assume that you have returned to your   regular daily activities without incident.  SIGNATURES/CONFIDENTIALITY: You and/or your care partner have signed paperwork which will be entered into your electronic medical record.  These signatures attest to the fact that that the information above on your After Visit Summary has been reviewed and is understood.  Full responsibility of the confidentiality of this discharge information lies with you and/or your care-partner.   Please follow all discharge instructions given to you by the recovery room nurse. If you have any questions or  problems after discharge please call one of the numbers listed above. You will receive a phone call in the am to see how you are doing and answer any questions you may have. Thank you for choosing [facility] for your health care needs.  

## 2013-04-11 NOTE — Progress Notes (Signed)
Lidocaine-40mg IV prior to Propofol InductionPropofol given over incremental dosages 

## 2013-04-11 NOTE — Op Note (Signed)
 [facility] [address] [place], [address]   COLONOSCOPY PROCEDURE REPORT  PATIENT: Chin, Kirk Anderson  MR#: [id number] BIRTHDATE: [birth year] , 67  yrs. old GENDER: Male ENDOSCOPIST: Roxy Cedar, MD REFERRED XB:JYNWG Norrine Ballester, M.D. PROCEDURE DATE:  04/11/2013 PROCEDURE:   Colonoscopy, screening ASA CLASS:   Class II INDICATIONS:average risk screening. MEDICATIONS: MAC sedation, administered by CRNA and propofol (Diprivan) 300mg  IV  DESCRIPTION OF PROCEDURE:   After the risks benefits and alternatives of the procedure were thoroughly explained, informed consent was obtained.  A digital rectal exam revealed no abnormalities of the rectum.   The LB [id number]  endoscope was introduced through the anus and advanced to the cecum, which was identified by both the appendix and ileocecal valve. No adverse events experienced.   The quality of the prep was good, using MoviPrep  The instrument was then slowly withdrawn as the colon was fully examined.      COLON FINDINGS: Moderate diverticulosis was noted in the sigmoid colon.   The colon was otherwise normal.  There was no inflammation, polyps or cancers .  Retroflexed views revealed internal hemorrhoids. The time to cecum=2 minutes 0 seconds. Withdrawal time=12 minutes 52 seconds.  The scope was withdrawn and the procedure completed. COMPLICATIONS: There were no complications.  ENDOSCOPIC IMPRESSION: 1.   Moderate diverticulosis was noted in the sigmoid colon 2.   The colon was otherwise normal  RECOMMENDATIONS: 1. Continue current colorectal screening recommendations for "routine risk" patients with a repeat colonoscopy in 10 years.   eSigned:  Roxy Cedar, MD 04/11/2013 10:41 AM   cc:  The Patient, and Corwin Levins, MD

## 2013-04-12 ENCOUNTER — Telehealth: Payer: Self-pay

## 2013-04-12 NOTE — Telephone Encounter (Signed)
  Follow up Call-  Call back number 04/11/2013  Post procedure Call Back phone  # [phone]  Permission to leave phone message Yes     Patient questions:  Do you have a fever, pain , or abdominal swelling? no Pain Score  0 *  Have you tolerated food without any problems? yes  Have you been able to return to your normal activities? yes  Do you have any questions about your discharge instructions: Diet   no Medications  no Follow up visit  no  Do you have questions or concerns about your Care? no  Actions: * If pain score is 4 or above: No action needed, pain <4.

## 2013-10-24 ENCOUNTER — Other Ambulatory Visit: Payer: Self-pay

## 2013-12-25 ENCOUNTER — Other Ambulatory Visit: Payer: Self-pay

## 2013-12-25 ENCOUNTER — Encounter: Payer: Self-pay | Admitting: Internal Medicine

## 2013-12-25 ENCOUNTER — Inpatient Hospital Stay (HOSPITAL_COMMUNITY)
Admission: EM | Admit: 2013-12-25 | Discharge: 2013-12-26 | DRG: 190 | Disposition: A | Discharge status: Home / Self Care | Payer: Medicare Other | Attending: Internal Medicine | Admitting: Internal Medicine

## 2013-12-25 ENCOUNTER — Emergency Department (HOSPITAL_COMMUNITY): Payer: Medicare Other

## 2013-12-25 ENCOUNTER — Encounter (HOSPITAL_COMMUNITY): Payer: Self-pay | Admitting: Emergency Medicine

## 2013-12-25 ENCOUNTER — Ambulatory Visit (INDEPENDENT_AMBULATORY_CARE_PROVIDER_SITE_OTHER): Payer: Medicare Other | Admitting: Internal Medicine

## 2013-12-25 VITALS — BP 142/80 | HR 122 | Temp 98.4°F | Resp 18 | Wt 176.0 lb

## 2013-12-25 DIAGNOSIS — J9600 Acute respiratory failure, unspecified whether with hypoxia or hypercapnia: Secondary | ICD-10-CM | POA: Insufficient documentation

## 2013-12-25 DIAGNOSIS — E785 Hyperlipidemia, unspecified: Secondary | ICD-10-CM | POA: Diagnosis present

## 2013-12-25 DIAGNOSIS — Z8249 Family history of ischemic heart disease and other diseases of the circulatory system: Secondary | ICD-10-CM

## 2013-12-25 DIAGNOSIS — K219 Gastro-esophageal reflux disease without esophagitis: Secondary | ICD-10-CM | POA: Diagnosis present

## 2013-12-25 DIAGNOSIS — I2510 Atherosclerotic heart disease of native coronary artery without angina pectoris: Secondary | ICD-10-CM | POA: Diagnosis present

## 2013-12-25 DIAGNOSIS — J449 Chronic obstructive pulmonary disease, unspecified: Secondary | ICD-10-CM | POA: Diagnosis present

## 2013-12-25 DIAGNOSIS — J4489 Other specified chronic obstructive pulmonary disease: Secondary | ICD-10-CM

## 2013-12-25 DIAGNOSIS — R000 Tachycardia, unspecified: Secondary | ICD-10-CM | POA: Insufficient documentation

## 2013-12-25 DIAGNOSIS — J440 Chronic obstructive pulmonary disease with acute lower respiratory infection: Principal | ICD-10-CM | POA: Diagnosis present

## 2013-12-25 DIAGNOSIS — J189 Pneumonia, unspecified organism: Secondary | ICD-10-CM

## 2013-12-25 DIAGNOSIS — J309 Allergic rhinitis, unspecified: Secondary | ICD-10-CM

## 2013-12-25 DIAGNOSIS — R079 Chest pain, unspecified: Secondary | ICD-10-CM

## 2013-12-25 DIAGNOSIS — Z87891 Personal history of nicotine dependence: Secondary | ICD-10-CM

## 2013-12-25 DIAGNOSIS — Z7982 Long term (current) use of aspirin: Secondary | ICD-10-CM

## 2013-12-25 DIAGNOSIS — J209 Acute bronchitis, unspecified: Principal | ICD-10-CM | POA: Diagnosis present

## 2013-12-25 LAB — URINALYSIS, ROUTINE W REFLEX MICROSCOPIC
BILIRUBIN URINE: NEGATIVE
Glucose, UA: NEGATIVE mg/dL
HGB URINE DIPSTICK: NEGATIVE
Ketones, ur: NEGATIVE mg/dL
Leukocytes, UA: NEGATIVE
Nitrite: NEGATIVE
PROTEIN: NEGATIVE mg/dL
Specific Gravity, Urine: 1.007 (ref 1.005–1.030)
Urobilinogen, UA: 0.2 mg/dL (ref 0.0–1.0)
pH: 7 (ref 5.0–8.0)

## 2013-12-25 LAB — CBC
HCT: 41.4 % (ref 39.0–52.0)
Hemoglobin: 13.5 g/dL (ref 13.0–17.0)
MCH: 28.7 pg (ref 26.0–34.0)
MCHC: 32.6 g/dL (ref 30.0–36.0)
MCV: 87.9 fL (ref 78.0–100.0)
PLATELETS: 448 10*3/uL — AB (ref 150–400)
RBC: 4.71 MIL/uL (ref 4.22–5.81)
RDW: 12.9 % (ref 11.5–15.5)
WBC: 11.1 10*3/uL — ABNORMAL HIGH (ref 4.0–10.5)

## 2013-12-25 LAB — LACTIC ACID, PLASMA: LACTIC ACID, VENOUS: 1.9 mmol/L (ref 0.5–2.2)

## 2013-12-25 LAB — BASIC METABOLIC PANEL
BUN: 8 mg/dL (ref 6–23)
CALCIUM: 9 mg/dL (ref 8.4–10.5)
CO2: 24 mEq/L (ref 19–32)
CREATININE: 0.7 mg/dL (ref 0.50–1.35)
Chloride: 101 mEq/L (ref 96–112)
GFR calc Af Amer: >90 mL/min (ref >90)
GFR calc non Af Amer: >90 mL/min (ref >90)
GLUCOSE: 94 mg/dL (ref 70–99)
Potassium: 4.4 mEq/L (ref 3.7–5.3)
SODIUM: 140 meq/L (ref 137–147)

## 2013-12-25 LAB — D-DIMER, QUANTITATIVE: D-Dimer, Quant: 0.34 ug{FEU}/mL (ref 0.00–0.48)

## 2013-12-25 LAB — POCT I-STAT TROPONIN I: TROPONIN I, POC: 0 ng/mL (ref 0.00–0.08)

## 2013-12-25 LAB — INFLUENZA PANEL BY PCR (TYPE A & B)
H1N1 flu by pcr: NOT DETECTED
Influenza A By PCR: NEGATIVE
Influenza B By PCR: NEGATIVE

## 2013-12-25 LAB — PRO B NATRIURETIC PEPTIDE: Pro B Natriuretic peptide (BNP): 41.2 pg/mL (ref 0–125)

## 2013-12-25 MED ORDER — GUAIFENESIN-DM 100-10 MG/5ML PO SYRP
5.0000 mL | ORAL_SOLUTION | ORAL | Status: DC | PRN
  Filled 2013-12-25: qty 5

## 2013-12-25 MED ORDER — BIOTENE DRY MOUTH MT LIQD
15.0000 mL | Freq: Two times a day (BID) | OROMUCOSAL | Status: DC
  Administered 2013-12-25 – 2013-12-26 (×2): 15 mL via OROMUCOSAL

## 2013-12-25 MED ORDER — DEXTROSE 5 % IV SOLN
1.0000 g | INTRAVENOUS | Status: DC
  Filled 2013-12-25: qty 10

## 2013-12-25 MED ORDER — LEVALBUTEROL HCL 0.63 MG/3ML IN NEBU
0.6300 mg | INHALATION_SOLUTION | Freq: Four times a day (QID) | RESPIRATORY_TRACT | Status: DC
  Administered 2013-12-25 – 2013-12-26 (×2): 0.63 mg via RESPIRATORY_TRACT
  Filled 2013-12-25 (×6): qty 3

## 2013-12-25 MED ORDER — LEVALBUTEROL HCL 0.63 MG/3ML IN NEBU: 0.6300 mg | INHALATION_SOLUTION | RESPIRATORY_TRACT | Status: DC | PRN

## 2013-12-25 MED ORDER — FAMOTIDINE 10 MG PO TABS
10.0000 mg | ORAL_TABLET | Freq: Every day | ORAL | Status: DC
  Administered 2013-12-25 – 2013-12-26 (×2): 10 mg via ORAL
  Filled 2013-12-25 (×2): qty 1

## 2013-12-25 MED ORDER — MENTHOL 3 MG MT LOZG
1.0000 | LOZENGE | OROMUCOSAL | Status: DC | PRN
  Filled 2013-12-25: qty 9

## 2013-12-25 MED ORDER — ALBUTEROL SULFATE (2.5 MG/3ML) 0.083% IN NEBU
2.5000 mg | INHALATION_SOLUTION | Freq: Once | RESPIRATORY_TRACT | Status: AC
  Administered 2013-12-25: 2.5 mg via RESPIRATORY_TRACT

## 2013-12-25 MED ORDER — ACETAMINOPHEN 325 MG PO TABS: 650.0000 mg | ORAL_TABLET | ORAL | Status: DC | PRN

## 2013-12-25 MED ORDER — DEXTROSE 5 % IV SOLN
500.0000 mg | Freq: Once | INTRAVENOUS | Status: AC
  Administered 2013-12-25: 500 mg via INTRAVENOUS

## 2013-12-25 MED ORDER — ASPIRIN 81 MG PO CHEW
81.0000 mg | CHEWABLE_TABLET | Freq: Every day | ORAL | Status: DC
  Administered 2013-12-25 – 2013-12-26 (×2): 81 mg via ORAL
  Filled 2013-12-25 (×2): qty 1

## 2013-12-25 MED ORDER — DEXTROSE 5 % IV SOLN
1.0000 g | Freq: Once | INTRAVENOUS | Status: AC
  Administered 2013-12-25: 1 g via INTRAVENOUS
  Filled 2013-12-25: qty 10

## 2013-12-25 MED ORDER — SODIUM CHLORIDE 0.9 % IV SOLN
INTRAVENOUS | Status: DC
  Administered 2013-12-25 – 2013-12-26 (×2): via INTRAVENOUS

## 2013-12-25 MED ORDER — GUAIFENESIN ER 600 MG PO TB12
1200.0000 mg | ORAL_TABLET | Freq: Two times a day (BID) | ORAL | Status: DC
  Administered 2013-12-25 – 2013-12-26 (×2): 1200 mg via ORAL
  Filled 2013-12-25 (×3): qty 2

## 2013-12-25 MED ORDER — IPRATROPIUM BROMIDE 0.02 % IN SOLN
0.5000 mg | Freq: Four times a day (QID) | RESPIRATORY_TRACT | Status: DC
  Administered 2013-12-25 – 2013-12-26 (×2): 0.5 mg via RESPIRATORY_TRACT
  Filled 2013-12-25 (×2): qty 2.5

## 2013-12-25 MED ORDER — ADULT MULTIVITAMIN W/MINERALS CH
1.0000 | ORAL_TABLET | Freq: Every day | ORAL | Status: DC
  Administered 2013-12-25 – 2013-12-26 (×2): 1 via ORAL
  Filled 2013-12-25 (×2): qty 1

## 2013-12-25 MED ORDER — PREDNISONE 20 MG PO TABS
60.0000 mg | ORAL_TABLET | Freq: Once | ORAL | Status: AC
  Administered 2013-12-25: 60 mg via ORAL
  Filled 2013-12-25: qty 3

## 2013-12-25 MED ORDER — ENOXAPARIN SODIUM 40 MG/0.4ML ~~LOC~~ SOLN
40.0000 mg | SUBCUTANEOUS | Status: DC
  Administered 2013-12-25: 22:00:00 40 mg via SUBCUTANEOUS
  Filled 2013-12-25 (×2): qty 0.4

## 2013-12-25 MED ORDER — NAPROXEN 500 MG PO TABS
500.0000 mg | ORAL_TABLET | Freq: Two times a day (BID) | ORAL | Status: DC | PRN
  Filled 2013-12-25: qty 1

## 2013-12-25 MED ORDER — BUDESONIDE-FORMOTEROL FUMARATE 160-4.5 MCG/ACT IN AERO
2.0000 | INHALATION_SPRAY | Freq: Two times a day (BID) | RESPIRATORY_TRACT | Status: DC
  Administered 2013-12-25 – 2013-12-26 (×2): 2 via RESPIRATORY_TRACT
  Filled 2013-12-25: qty 6

## 2013-12-25 MED ORDER — AZITHROMYCIN 500 MG IV SOLR
500.0000 mg | INTRAVENOUS | Status: DC
  Filled 2013-12-25: qty 500

## 2013-12-25 MED ORDER — ONDANSETRON HCL 4 MG/2ML IJ SOLN: 4.0000 mg | Freq: Four times a day (QID) | INTRAMUSCULAR | Status: DC | PRN

## 2013-12-25 MED ORDER — ALBUTEROL SULFATE HFA 108 (90 BASE) MCG/ACT IN AERS
6.0000 | INHALATION_SPRAY | RESPIRATORY_TRACT | Status: AC
  Administered 2013-12-25 (×3): 6 via RESPIRATORY_TRACT
  Filled 2013-12-25: qty 6.7

## 2013-12-25 NOTE — Patient Instructions (Signed)
Please go [facility] ER [facility] for further evaluation

## 2013-12-25 NOTE — ED Provider Notes (Signed)
CSN: [id number]     Arrival date & time 12/25/13  1200 History   First MD Initiated Contact with Patient 12/25/13 1409     Chief Complaint  Patient presents with  . Cough   (Consider location/radiation/quality/duration/timing/severity/associated sxs/prior Treatment) Patient is a 68 y.o. male presenting with shortness of breath.  Shortness of Breath Severity:  Moderate Onset quality:  Gradual Duration:  1 week Timing:  Constant Progression:  Worsening Chronicity:  New Context: activity and URI   Relieved by:  Oxygen and rest Worsened by:  Activity Ineffective treatments: albuterol. Associated symptoms: cough and fever   Associated symptoms: no abdominal pain, no chest pain and no vomiting     Past Medical History  Diagnosis Date  . EMPHYSEMA 10/29/2007  . CORONARY ARTERY DISEASE 05/26/2008    minor by cath 2000  . ALLERGIC RHINITIS 05/26/2008  . GERD 05/26/2008  . COPD (chronic obstructive pulmonary disease)     PFT's 03/24/05 FEV1 1.37 ratio 42, 21% improvement in FVC after B2, DLCO 80 -PFT's 06/29/09 1.19 ratio 29 6% better after B2, DLC0 73 -HFA 75% 06/29/2009 >90% 08/10/2009  . Multiple rib fractures 02/2000  . Hyperlipidemia    Past Surgical History  Procedure Laterality Date  . Appendectomy    . Hemorrhoid surgery     Family History  Problem Relation Age of Onset  . Aneurysm Mother     Vacular Aneurysm  . Heart disease Father     CAD/Father had MI in his 4240's  . Alcohol abuse Brother   . Colon cancer Neg Hx    History  Substance Use Topics  . Smoking status: Former Smoker -- 2.00 packs/day for 30 years    Types: Cigarettes    Quit date: 12/19/1990  . Smokeless tobacco: Current User    Types: Chew  . 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  HENT: Negative for congestion.   Respiratory: Positive for cough and shortness of breath.   Cardiovascular: Negative for chest pain.   Gastrointestinal: Negative for nausea, vomiting, abdominal pain and diarrhea.  All other systems reviewed and are negative.    Allergies  Sudafed  Home Medications   Current Outpatient Rx  Name  Route  Sig  Dispense  Refill  . aspirin 81 MG tablet   Oral   Take 81 mg by mouth daily.           . budesonide-formoterol (SYMBICORT) 160-4.5 MCG/ACT inhaler   Inhalation   Inhale 2 puffs into the lungs 2 (two) times daily.   3 Inhaler   3     Defer to PCP next time, or advise pt will need ov  ...   . famotidine (PEPCID AC) 10 MG chewable tablet   Oral   Chew 10 mg by mouth daily as needed for heartburn.          Marland Kitchen. guaiFENesin (MUCINEX) 600 MG 12 hr tablet   Oral   Take 600-1,200 mg by mouth 2 (two) times daily as needed for cough. 1-2 tablets every 12 hours as needed         . Multiple Vitamin (MULTIVITAMIN) tablet   Oral   Take 1 tablet by mouth daily.           . naproxen sodium (ANAPROX) 220 MG tablet   Oral   Take 440 mg by mouth 2 (two) times daily as needed (for pain).          .Marland Kitchen  tiotropium (SPIRIVA HANDIHALER) 18 MCG inhalation capsule   Inhalation   Place 1 capsule (18 mcg total) into inhaler and inhale daily.   90 capsule   3    BP 150/97  Pulse 108  Temp(Src) 97.5 F (36.4 C) (Oral)  Resp 20  Wt 176 lb 1.6 oz (79.878 kg)  SpO2 89% Physical Exam  Nursing note and vitals reviewed. Constitutional: He is oriented to person, place, and time. He appears well-developed and well-nourished. No distress.  HENT:  Head: Normocephalic and atraumatic.  Mouth/Throat: Oropharynx is clear and moist.  Eyes: Conjunctivae are normal. Pupils are equal, round, and reactive to light. No scleral icterus.  Neck: Neck supple.  Cardiovascular: Normal rate, regular rhythm, normal heart sounds and intact distal pulses.   No murmur heard. Pulmonary/Chest: Effort normal. No stridor. No respiratory distress. He has decreased breath sounds. He has no wheezes. He has no  rales.  Abdominal: Soft. He exhibits no distension. There is no tenderness.  Musculoskeletal: Normal range of motion. He exhibits no edema.  Neurological: He is alert and oriented to person, place, and time.  Skin: Skin is warm and dry. No rash noted.  Psychiatric: He has a normal mood and affect. His behavior is normal.    ED Course  Procedures (including critical care time) Labs Review Labs Reviewed  CBC - Abnormal; Notable for the following:    WBC 11.1 (*)    Platelets 448 (*)    All other components within normal limits  BASIC METABOLIC PANEL  PRO B NATRIURETIC PEPTIDE  POCT I-STAT TROPONIN I   Imaging Review Dg Chest 2 View (if Patient Has Fever And/or Copd)  12/25/2013   CLINICAL DATA:  Cough, history emphysema, COPD, coronary artery disease, hyperlipidemia  EXAM: CHEST  2 VIEW  COMPARISON:  10/07/2011  FINDINGS: Normal heart size, mediastinal contours, and pulmonary vascularity.  Emphysematous and bronchitic changes consistent with COPD.  Subsegmental atelectasis versus scarring right lower lobe.  No acute infiltrate, pleural effusion or pneumothorax.  Old bilateral rib fractures.  Dextro convex thoracolumbar scoliosis.  IMPRESSION: COPD changes with subsegmental atelectasis versus scarring and right lower lobe.  No acute abnormalities.   Electronically Signed   By: Ulyses Southward M.D.   On: 12/25/2013 13:11  All radiology studies independently viewed by me.     EKG Interpretation   None     EKG- sinus tachycardia, rate 102, normal axis, normal intervals, no ST/T changes, similar to most recent EKG.  MDM   1. Acute respiratory failure   2. Allergic rhinitis, cause unspecified   3. CAP (community acquired pneumonia)   4. COPD (chronic obstructive pulmonary disease)   5. Tachycardia    68 yo male presenting with hypoxia in the setting of 2 weeks of worsening URI symptoms, cough, fevers.  O2 sat's improved with supplemental O2, did not improve with additional albuterol.   Symptoms most consistent with pneumonia.  His CXR showed some atelectasis in RLL, which could represent pneumonia.  BNP still pending, but anticipate admission.  Given Azithro and Ceftriaxone.    Will be admitted to Internal Medicine.    Candyce Churn, MD 12/25/13 [phone]

## 2013-12-25 NOTE — H&P (Signed)
History and Physical       Hospital Admission Note Date: 12/25/2013  Patient name: Kirk AngDouglas Anderson Medical record number: [id number] Date of birth: 04/01/1946 Age: 68 y.o. Gender: male  PCP: Oliver BarreJames [name], MD    Chief Complaint:  Shortness of breath with coughing, congestion, productive phlegm for last 2weeks  HPI: Patient is a 68 year old male with history of CAD, emphysema, GERD, COPD, presented to his PCPs office today with 2 weeks of upper respiratory symptoms. Patient reported productive cough with greenish phlegm, fevers, chest congestion, sore throat, fatigue. He has not received a flu shot this year. Patient was also having pleuritic chest discomfort with coughing and has been sleeping in the recliner.  Patient was seen to have hypoxia 88% with tachycardia 118, and with ambulation desatted to 85% with heart rate in 120s, hence patient was sent to ER for evaluation. He also reports poor appetite and has not been eating anything in the last 2 weeks. EKG showed sinus tachycardia otherwise no acute ST-T wave changes just above ischemia. He denies any orthopnea, PND, weight gain, any peripheral edema, long-distance travels/  Flight.  Review of Systems:  Constitutional: + fever, chills, diaphoresis, poor appetite and fatigue.  HEENT: Denies photophobia, eye pain, redness, hearing loss, ear pain, + congestion, sore throat, rhinorrhea, sneezing,  no mouth sores, trouble swallowing, neck pain, neck stiffness and tinnitus.   Respiratory: please see history of present illness  Cardiovascular: Denies chest pain, palpitations and leg swelling.  Gastrointestinal: Denies nausea, vomiting, abdominal pain, diarrhea, constipation, blood in stool and abdominal distention. + poor appetite  Genitourinary: Denies dysuria, urgency, frequency, hematuria, flank pain and difficulty urinating.  Musculoskeletal: Denies myalgias, back pain, joint swelling,  arthralgias and gait problem.  Skin: Denies pallor, rash and wound.  Neurological: Denies dizziness, seizures, syncope, weakness, light-headedness, numbness and headaches. + generalized weakness and fatigue  Hematological: Denies adenopathy. Easy bruising, personal or family bleeding history  Psychiatric/Behavioral: Denies suicidal ideation, mood changes, confusion, nervousness, sleep disturbance and agitation  Past Medical History: Past Medical History  Diagnosis Date  . EMPHYSEMA 10/29/2007  . CORONARY ARTERY DISEASE 05/26/2008    minor by cath 2000  . ALLERGIC RHINITIS 05/26/2008  . GERD 05/26/2008  . COPD (chronic obstructive pulmonary disease)     PFT's 03/24/05 FEV1 1.37 ratio 42, 21% improvement in FVC after B2, DLCO 80 -PFT's 06/29/09 1.19 ratio 29 6% better after B2, DLC0 73 -HFA 75% 06/29/2009 >90% 08/10/2009  . Multiple rib fractures 02/2000  . Hyperlipidemia    Past Surgical History  Procedure Laterality Date  . Appendectomy    . Hemorrhoid surgery      Medications: Prior to Admission medications   Medication Sig Start Date End Date Taking? Authorizing Provider  aspirin 81 MG tablet Take 81 mg by mouth daily.     Yes Historical Provider, MD  budesonide-formoterol (SYMBICORT) 160-4.5 MCG/ACT inhaler Inhale 2 puffs into the lungs 2 (two) times daily. 02/12/13  Yes Corwin LevinsJames W [name], MD  famotidine (PEPCID AC) 10 MG chewable tablet Chew 10 mg by mouth daily as needed for heartburn.    Yes Historical Provider, MD  guaiFENesin (MUCINEX) 600 MG 12 hr tablet Take 600-1,200 mg by mouth 2 (two) times daily as needed for cough. 1-2 tablets every 12 hours as needed   Yes Historical Provider, MD  Multiple Vitamin (MULTIVITAMIN) tablet Take 1 tablet by mouth daily.     Yes Historical Provider, MD  naproxen sodium (ANAPROX) 220 MG tablet Take 440 mg by  mouth 2 (two) times daily as needed (for pain).    Yes Historical Provider, MD  tiotropium (SPIRIVA HANDIHALER) 18 MCG inhalation capsule Place 1  capsule (18 mcg total) into inhaler and inhale daily. 02/12/13  Yes Corwin Levins, MD    Allergies:   Allergies  Allergen Reactions  . Sudafed [Pseudoephedrine Hcl]     Urinary retention    Social History:  reports that he quit smoking about 23 years ago. His smoking use included Cigarettes. He has a 60 pack-year smoking history. His smokeless tobacco use includes Chew. He reports that he does not drink alcohol or use illicit drugs.  Family History: Family History  Problem Relation Age of Onset  . Aneurysm Mother     Vacular Aneurysm  . Heart disease Father     CAD/Father had MI in his 42's  . Alcohol abuse Brother   . Colon cancer Neg Hx     Physical Exam: Blood pressure 115/71, pulse 110, temperature 98.5 F (36.9 C), temperature source Oral, resp. rate 25, weight 79.878 kg (176 lb 1.6 oz), SpO2 90.00%. General: Alert, awake, oriented x3, in no acute distress. HEENT: normocephalic, atraumatic, anicteric sclera, pink conjunctiva, pupils equal and reactive to light and accomodation, oropharynx clear dry mucosal membranes ,  Neck: supple, no masses or lymphadenopathy, no goiter, no bruits  Heart: tachycardia,  Regular rate and rhythm, without murmurs, rubs or gallops. Lungs:  decreased breath sounds throughout however no wheezing or rhonchi  Abdomen: Soft, nontender, nondistended, positive bowel sounds, no masses. Extremities: No clubbing, cyanosis or edema with positive pedal pulses. Neuro: Grossly intact, no focal neurological deficits, strength 5/5 upper and lower extremities bilaterally Psych: alert and oriented x 3, normal mood and affect Skin: no rashes or lesions, warm and dry   LABS on Admission:  Basic Metabolic Panel:  Recent Labs Lab 12/25/13 1330  NA 140  K 4.4  CL 101  CO2 24  GLUCOSE 94  BUN 8  CREATININE 0.70  CALCIUM 9.0   Liver Function Tests: No results found for this basename: AST, ALT, ALKPHOS, BILITOT, PROT, ALBUMIN,  in the last 168 hours No  results found for this basename: LIPASE, AMYLASE,  in the last 168 hours No results found for this basename: AMMONIA,  in the last 168 hours CBC:  Recent Labs Lab 12/25/13 1330  WBC 11.1*  HGB 13.5  HCT 41.4  MCV 87.9  PLT 448*   Cardiac Enzymes: No results found for this basename: CKTOTAL, CKMB, CKMBINDEX, TROPONINI,  in the last 168 hours BNP: No components found with this basename: POCBNP,  CBG: No results found for this basename: GLUCAP,  in the last 168 hours   Radiological Exams on Admission: Dg Chest 2 View (if Patient Has Fever And/or Copd)  12/25/2013   CLINICAL DATA:  Cough, history emphysema, COPD, coronary artery disease, hyperlipidemia  EXAM: CHEST  2 VIEW  COMPARISON:  10/07/2011  FINDINGS: Normal heart size, mediastinal contours, and pulmonary vascularity.  Emphysematous and bronchitic changes consistent with COPD.  Subsegmental atelectasis versus scarring right lower lobe.  No acute infiltrate, pleural effusion or pneumothorax.  Old bilateral rib fractures.  Dextro convex thoracolumbar scoliosis.  IMPRESSION: COPD changes with subsegmental atelectasis versus scarring and right lower lobe.  No acute abnormalities.   Electronically Signed   By: Ulyses Southward M.D.   On: 12/25/2013 13:11    Assessment/Plan Principal Problem:   Acute respiratory failure with hypoxia, COPD exacerbation with community acquired pneumonia: Rule out influenza -  Placed on droplet precautions, obtain influenza PCR, urine strep antigen, urine Legionella antigen, blood cultures, lactic acid, d-dimer, HIV, sputum culture -Placed on IV Rocephin and Zithromax, placed on scheduled nebulizer treatments with Xopenex and Atrovent, continue Symbicort, patient was given prednisone 60 mg in ED although he's not having any wheezing at this time. Will reassess in a.m. if he needs steroids. - Continue IV fluid hydration, supportive treatment    Active Problems:   CORONARY ARTERY DISEASE - Continue aspirin     COPD (chronic obstructive pulmonary disease): As #1    GERD: Continue Pepcid    Tachycardia - Likely due to acute illness, hypoxia, dehydration, obtain d-dimer, and follow cultures.  - EKG showed sinus tachycardia with no acute ST-T wave changes suggestive of any ischemia.  DVT prophylaxis: Lovenox  CODE STATUS: Full code  Family Communication: Admission, patients condition and plan of care including tests being ordered have been discussed with the patient and wife who indicates understanding and agree with the plan and Code Status   Further plan will depend as patient's clinical course evolves and further radiologic and laboratory data become available.   Time Spent on Admission: 45 minutes  Olanrewaju Osborn M.D. [facility] 12/25/2013, 5:16 PM Pager: [phone]  If 7PM-7AM, please contact night-coverage [email or web address] Password [email or web address]

## 2013-12-25 NOTE — Progress Notes (Signed)
Subjective:    Patient ID: Kirk Anderson, male    DOB: Sep 09, 1946, 68 y.o.   MRN: [id number]  HPI  Here with 2 wks onset prod cough, feverish, weakness, decreased apetitie, hard to sleep with cough, fatigue, grad worse in past few days.  Has some diffuse ant chest soreness with coughing, worse at night, sleeping in recliner.  Has severe HA this am as well. Pt denies other chest pain, orthopnea, PND, increased LE swelling, palpitations, dizziness or syncope. Found to have in initial o2 sat 88% with HR 118, no significant improvement with neb tx in office, then 85% sat with ambulation and HR 120's.  ECG being done now Past Medical History  Diagnosis Date  . EMPHYSEMA 10/29/2007  . CORONARY ARTERY DISEASE 05/26/2008    minor by cath 2000  . ALLERGIC RHINITIS 05/26/2008  . GERD 05/26/2008  . COPD (chronic obstructive pulmonary disease)     PFT's 03/24/05 FEV1 1.37 ratio 42, 21% improvement in FVC after B2, DLCO 80 -PFT's 06/29/09 1.19 ratio 29 6% better after B2, DLC0 73 -HFA 75% 06/29/2009 >90% 08/10/2009  . Multiple rib fractures 02/2000  . Hyperlipidemia    Past Surgical History  Procedure Laterality Date  . Appendectomy    . Hemorrhoid surgery      reports that he quit smoking about 23 years ago. His smoking use included Cigarettes. He has a 60 pack-year smoking history. His smokeless tobacco use includes Chew. He reports that he does not drink alcohol or use illicit drugs. family history includes Alcohol abuse in his brother; Aneurysm in his mother; Heart disease in his father. There is no history of Colon cancer. Allergies  Allergen Reactions  . Sudafed [Pseudoephedrine Hcl]     Urinary retention   Current Outpatient Prescriptions on File Prior to Visit  Medication Sig Dispense Refill  . aspirin 81 MG tablet Take 81 mg by mouth daily.        . B Complex Vitamins (B COMPLEX 100 PO) Take 1 tablet by mouth daily.        . budesonide-formoterol (SYMBICORT) 160-4.5 MCG/ACT inhaler Inhale  2 puffs into the lungs 2 (two) times daily.  3 Inhaler  3  . cetirizine (ZYRTEC) 10 MG tablet Take 10 mg by mouth daily as needed.        . famotidine (PEPCID AC) 10 MG chewable tablet Chew 10 mg by mouth daily as needed.        Marland Kitchen guaiFENesin (MUCINEX) 600 MG 12 hr tablet 1-2 tablets every 12 hours as needed       . MOVIPREP 100 G SOLR moviprep as directed. No substitutions  1 kit  0  . Multiple Vitamin (MULTIVITAMIN) tablet Take 1 tablet by mouth daily.        . naproxen sodium (ANAPROX) 220 MG tablet Take 220 mg by mouth. As needed       . tiotropium (SPIRIVA HANDIHALER) 18 MCG inhalation capsule Place 1 capsule (18 mcg total) into inhaler and inhale daily.  90 capsule  3   No current facility-administered medications on file prior to visit.   Review of Systems  Constitutional: Negative for unexpected weight change, or unusual diaphoresis  HENT: Negative for tinnitus.   Eyes: Negative for photophobia and visual disturbance.  Respiratory: Negative for choking and stridor.   Gastrointestinal: Negative for vomiting and blood in stool.  Genitourinary: Negative for hematuria and decreased urine volume.  Musculoskeletal: Negative for acute joint swelling Skin: Negative for color change and  wound.  Neurological: Negative for tremors and numbness other than noted  Psychiatric/Behavioral: Negative for decreased concentration or  hyperactivity.       Objective:   Physical Exam BP 142/80  Pulse 122  Temp(Src) 98.4 F (36.9 C) (Oral)  Resp 18  Wt 176 lb (79.833 kg)  SpO2 85% VS noted, mild to mod ill appaering Constitutional: Pt appears well-developed and well-nourished.  HENT: Head: NCAT.  Right Ear: External ear normal.  Left Ear: External ear normal.  Bilat tm's with mild erythema.  Max sinus areas non tender.  Pharynx with mild erythema, no exudate Eyes: Conjunctivae and EOM are normal. Pupils are equal, round, and reactive to light.  Neck: Normal range of motion. Neck supple.    Cardiovascular: Normal rate and regular rhythm.   Pulmonary/Chest: Effort normal and breath sounds decrease bilat, with Right basilar marked decreased, no wheezing/rales.  Abd:  Soft, NT, non-distended, + BS Neurological: Pt is alert. Not confused  Skin: Skin is warm. No erythema. No LE edema Psychiatric: Pt behavior is normal. Thought content normal.      Assessment & Plan:

## 2013-12-25 NOTE — ED Notes (Signed)
Per pt sts he hasn't felt well x 2 weeks. Sent here by doctor for possible PNA and low sats.. Pt hx of COPD. sats low at triage.

## 2013-12-25 NOTE — Progress Notes (Signed)
O2 85, PR 122 after 4 laps around nurses station

## 2013-12-25 NOTE — Progress Notes (Signed)
Pre-visit discussion using our clinic review tool. No additional management support is needed unless otherwise documented below in the visit note.  

## 2013-12-25 NOTE — Progress Notes (Signed)
O2 and pulse rechecked after nebulizer treatment.

## 2013-12-25 NOTE — Assessment & Plan Note (Addendum)
Mild hypoxic, no improvement with alb neb, worse sat with ambulation, with tachycardia, recent fever - to ER for futher eval, suspect RLL pneumonia  Note:  Total time for pt hx, exam, review of record with pt in the room, determination of diagnoses and plan for further eval and tx is > 40 min, with over 50% spent in coordination and counseling of patient  ECG reviewed as per emr - sinus tachycardia, no acute changes

## 2013-12-25 NOTE — Progress Notes (Signed)
Attempted to call for report, nurse was reported to be busy. Madelin RearLonnie Filipe Greathouse, MSN, RN, [employer] EnergyCMSRN

## 2013-12-25 NOTE — Addendum Note (Signed)
Addended by: Cristela BluePULLIAM, Ima Hafner W on: 12/25/2013 11:35 AM   Modules accepted: Orders

## 2013-12-25 NOTE — Addendum Note (Signed)
Addended by: Cristela BluePULLIAM, Jakai Onofre W on: 12/25/2013 12:09 PM   Modules accepted: Orders

## 2013-12-25 NOTE — Assessment & Plan Note (Signed)
?   MSK, vs pna vs other, has hx of minor CAD by cath 2000 - for [facility] eval as well

## 2013-12-25 NOTE — Assessment & Plan Note (Signed)
O/w stable overall by history and exam, recent data reviewed with pt, and pt to continue medical treatment as before,  to f/u any worsening symptoms or concerns 

## 2013-12-25 NOTE — Assessment & Plan Note (Signed)
Suspect secondary to above, for ecg today

## 2013-12-26 DIAGNOSIS — R079 Chest pain, unspecified: Secondary | ICD-10-CM

## 2013-12-26 LAB — LEGIONELLA ANTIGEN, URINE: Legionella Antigen, Urine: NEGATIVE

## 2013-12-26 LAB — CBC
HCT: 37 % — ABNORMAL LOW (ref 39.0–52.0)
Hemoglobin: 12.3 g/dL — ABNORMAL LOW (ref 13.0–17.0)
MCH: 29.2 pg (ref 26.0–34.0)
MCHC: 33.2 g/dL (ref 30.0–36.0)
MCV: 87.9 fL (ref 78.0–100.0)
Platelets: 417 10*3/uL — ABNORMAL HIGH (ref 150–400)
RBC: 4.21 MIL/uL — ABNORMAL LOW (ref 4.22–5.81)
RDW: 12.9 % (ref 11.5–15.5)
WBC: 8.6 10*3/uL (ref 4.0–10.5)

## 2013-12-26 LAB — EXPECTORATED SPUTUM ASSESSMENT W REFEX TO RESP CULTURE

## 2013-12-26 LAB — BASIC METABOLIC PANEL
BUN: 7 mg/dL (ref 6–23)
CO2: 25 meq/L (ref 19–32)
Calcium: 9 mg/dL (ref 8.4–10.5)
Chloride: 101 mEq/L (ref 96–112)
Creatinine, Ser: 0.63 mg/dL (ref 0.50–1.35)
GFR calc Af Amer: >90 mL/min (ref >90)
GLUCOSE: 116 mg/dL — AB (ref 70–99)
POTASSIUM: 4.1 meq/L (ref 3.7–5.3)
Sodium: 140 mEq/L (ref 137–147)

## 2013-12-26 LAB — STREP PNEUMONIAE URINARY ANTIGEN: Strep Pneumo Urinary Antigen: NEGATIVE

## 2013-12-26 LAB — HIV ANTIBODY (ROUTINE TESTING W REFLEX): HIV: NONREACTIVE

## 2013-12-26 LAB — EXPECTORATED SPUTUM ASSESSMENT W GRAM STAIN, RFLX TO RESP C

## 2013-12-26 MED ORDER — GUAIFENESIN ER 600 MG PO TB12: 1200.0000 mg | ORAL_TABLET | Freq: Two times a day (BID) | ORAL | Status: DC

## 2013-12-26 MED ORDER — LEVOFLOXACIN 750 MG PO TABS: 750.0000 mg | ORAL_TABLET | Freq: Every day | ORAL | Status: DC

## 2013-12-26 NOTE — Evaluation (Signed)
Physical Therapy Evaluation Patient Details Name: Lorette AngDouglas Pembroke MRN: [id number] DOB: 09/10/1946 Today's Date: 12/26/2013 Time: [id number] PT Time Calculation (min): 15 min  PT Assessment / Plan / Recommendation History of Present Illness  Patient is a 68 year old male with history of CAD, emphysema, GERD, COPD, presented to his PCPs office today with 2 weeks of upper respiratory symptoms. Patient reported productive cough with greenish phlegm, fevers, chest congestion, sore throat, fatigue. He has not received a flu shot this year. Patient was also having pleuritic chest discomfort with coughing and has been sleeping in the recliner.  Clinical Impression  Pt is currently independent with all mobility, transfers and gait.  No acute PT needs identified at this time.    PT Assessment  Patent does not need any further PT services    Follow Up Recommendations  No PT follow up    Does the patient have the potential to tolerate intense rehabilitation      Barriers to Discharge        Equipment Recommendations  None recommended by PT    Recommendations for Other Services     Frequency      Precautions / Restrictions Restrictions Weight Bearing Restrictions: No   Pertinent Vitals/Pain spO2 on room air: 95% at rest, 90-92% with gait No c/o pain      Mobility  Bed Mobility Overal bed mobility: Independent Transfers Overall transfer level: Independent Ambulation/Gait Ambulation/Gait assistance: Independent Ambulation Distance (Feet): 250 Feet Assistive device: None General Gait Details: spO2 on room air: 95% at rest, 90-92% with gait    Exercises     PT Diagnosis:    PT Problem List:   PT Treatment Interventions:       PT Goals(Current goals can be found in the care plan section) Acute Rehab PT Goals PT Goal Formulation: No goals set, d/c therapy  Visit Information  Last PT Received On: 12/26/13 Assistance Needed: +1 History of Present Illness: Patient is a  68 year old male with history of CAD, emphysema, GERD, COPD, presented to his PCPs office today with 2 weeks of upper respiratory symptoms. Patient reported productive cough with greenish phlegm, fevers, chest congestion, sore throat, fatigue. He has not received a flu shot this year. Patient was also having pleuritic chest discomfort with coughing and has been sleeping in the recliner.       Prior Functioning  Home Living Family/patient expects to be discharged to:: Private residence Living Arrangements: Spouse/significant other Available Help at Discharge: Family Type of Home: House Home Access: Stairs to enter Secretary/administratorntrance Stairs-Number of Steps: 3 Entrance Stairs-Rails: Left Home Layout: Two level Home Equipment: None Prior Function Level of Independence: Independent Communication Communication: No difficulties    Cognition  Cognition Arousal/Alertness: Awake/alert Behavior During Therapy: WFL for tasks assessed/performed Overall Cognitive Status: Within Functional Limits for tasks assessed    Extremity/Trunk Assessment Upper Extremity Assessment Upper Extremity Assessment: Overall WFL for tasks assessed Lower Extremity Assessment Lower Extremity Assessment: Overall WFL for tasks assessed Cervical / Trunk Assessment Cervical / Trunk Assessment: Normal   Balance    End of Session PT - End of Session Equipment Utilized During Treatment: Gait belt Activity Tolerance: Patient tolerated treatment well Patient left: in chair;with call bell/phone within reach Nurse Communication: Mobility status  GP     Kayse Puccini 12/26/2013, 11:00 AM

## 2013-12-26 NOTE — Care Management Note (Signed)
    Page 1 of 1   12/26/2013     12:32:41 PM   CARE MANAGEMENT NOTE 12/26/2013  Patient:  Kirk Anderson,Kirk Anderson   Account Number:  [id number]  Date Initiated:  12/26/2013  Documentation initiated by:  Letha CapeAYLOR,Vihaan Gloss  Subjective/Objective Assessment:   dx copd, cap  admit- lives with spouse.     Action/Plan:   pt eval-no pt follow up needed.   Anticipated DC Date:  12/26/2013   Anticipated DC Plan:  HOME/SELF CARE      DC Planning Services  CM consult      Choice offered to / List presented to:             Status of service:  Completed, signed off Medicare Important Message given?   (If response is "NO", the following Medicare IM given date fields will be blank) Date Medicare IM given:   Date Additional Medicare IM given:    Discharge Disposition:  HOME/SELF CARE  Per UR Regulation:  Reviewed for med. necessity/level of care/duration of stay  If discussed at Long Length of Stay Meetings, dates discussed:    Comments:  12/26/13 10:33 Letha Capeeborah Dohn Stclair RN, BSN [phone] patient lives with spouse, await pt eval.  NCM will continue to follow for dc needs.  Per physical therapy , patient has no pt needs.

## 2013-12-26 NOTE — Progress Notes (Signed)
NURSING PROGRESS NOTE  Kirk Anderson [id number] Discharge Data: 12/26/2013 11:29 AM Attending Provider: Cathren Harshipudeep K Rai, MD WUJ:[id number]:James Jonny RuizJohn, MD     Kirk Angouglas Socorro to be D/C'd Home per MD order.  Discussed with the patient the After Visit Summary and all questions fully answered. All IV's discontinued with no bleeding noted. All belongings returned to patient for patient to take home.   Last Vital Signs:  Blood pressure 133/82, pulse 92, temperature 98.2 F (36.8 C), temperature source Oral, resp. rate 18, height 5\' 10"  (1.778 m), weight 78.8 kg (173 lb 11.6 oz), SpO2 95.00%.  Discharge Medication List   Medication List    STOP taking these medications       cetirizine 10 MG tablet  Commonly known as:  ZYRTEC      TAKE these medications       aspirin 81 MG tablet  Take 81 mg by mouth daily.     budesonide-formoterol 160-4.5 MCG/ACT inhaler  Commonly known as:  SYMBICORT  Inhale 2 puffs into the lungs 2 (two) times daily.     famotidine 10 MG chewable tablet  Commonly known as:  PEPCID AC  Chew 10 mg by mouth daily as needed for heartburn.     guaiFENesin 600 MG 12 hr tablet  Commonly known as:  MUCINEX  Take 2 tablets (1,200 mg total) by mouth 2 (two) times daily.     levofloxacin 750 MG tablet  Commonly known as:  LEVAQUIN  Take 1 tablet (750 mg total) by mouth daily.     multivitamin tablet  Take 1 tablet by mouth daily.     naproxen sodium 220 MG tablet  Commonly known as:  ANAPROX  Take 440 mg by mouth 2 (two) times daily as needed (for pain).     tiotropium 18 MCG inhalation capsule  Commonly known as:  SPIRIVA HANDIHALER  Place 1 capsule (18 mcg total) into inhaler and inhale daily.

## 2013-12-26 NOTE — Discharge Summary (Signed)
Physician Discharge Summary  Kirk Anderson WUJ:[id number] DOB: 1946/06/08 DOA: 12/25/2013  PCP: Oliver Barre, MD  Admit date: 12/25/2013 Discharge date: 12/26/2013  Time spent: 40 minutes  Recommendations for Outpatient Follow-up:  1. Followup with primary care physician within one week.  Discharge Diagnoses:  Principal Problem:   Acute respiratory failure Active Problems:   CORONARY ARTERY DISEASE   COPD (chronic obstructive pulmonary disease)   GERD   Tachycardia   CAP (community acquired pneumonia)   Discharge Condition: Stable  Diet recommendation: Heart healthy diet  Filed Weights   12/25/13 1207 12/25/13 1942  Weight: 79.878 kg (176 lb 1.6 oz) 78.8 kg (173 lb 11.6 oz)    History of present illness:  Patient is a 68 year old male with history of CAD, emphysema, GERD, COPD, presented to his PCPs office today with 2 weeks of upper respiratory symptoms. Patient reported productive cough with greenish phlegm, fevers, chest congestion, sore throat, fatigue. He has not received a flu shot this year. Patient was also having pleuritic chest discomfort with coughing and has been sleeping in the recliner.  Patient was seen to have hypoxia 88% with tachycardia 118, and with ambulation desatted to 85% with heart rate in 120s, hence patient was sent to ER for evaluation. He also reports poor appetite and has not been eating anything in the last 2 weeks.  EKG showed sinus tachycardia otherwise no acute ST-T wave changes just above ischemia.  He denies any orthopnea, PND, weight gain, any peripheral edema, long-distance travels/ Flight.  Hospital Course:   1. Acute hypoxic respiratory failure: This is secondary to COPD exacerbation, patient given to the hospital with several days of fever, chest congestion and greenish phlegm. Chest x-ray showed no evidence of pneumonia. Because of the fever and the upper respiratory tract symptoms fluid was checked and it was negative. Patient oxygen  saturation was in the 80s at the time of admission, now saturation in the mid 90s even with ambulation.  2. Acute COPD exacerbation with acute bronchitis: As mentioned above, fevers, chest congestion and greenish phlegm. Patient did not have impressive wheezing or not any. Started on IV antibiotics, bronchodilators, pertussis and mucolytics and oxygen as needed. Patient did not have wheezing as mentioned above so one dose of steroids was given in the emergency department at the time of discharge I elected not to prescribe steroids. Patient discharged on Levaquin for 5 more days.  3. History of CAD: Continue aspirin.  4. Tachycardia: Patient was tachycardic, this is likely secondary to the hypoxia and lung infection. Although he did not have any risk factors for PE but D. dimers were checked and is negative but a 0.3. Tachycardia resolved today after the hypoxia resolved.  Procedures:  None  Consultations:  None  Discharge Exam: Filed Vitals:   12/26/13 0605  BP: 133/82  Pulse: 92  Temp: 98.2 F (36.8 C)  Resp: 18   General: Alert and awake, oriented x3, not in any acute distress. HEENT: anicteric sclera, pupils reactive to light and accommodation, EOMI CVS: S1-S2 clear, no murmur rubs or gallops Chest: clear to auscultation bilaterally, no wheezing, rales or rhonchi Abdomen: soft nontender, nondistended, normal bowel sounds, no organomegaly Extremities: no cyanosis, clubbing or edema noted bilaterally Neuro: Cranial nerves II-XII intact, no focal neurological deficits  Discharge Instructions  Discharge Orders   Future Orders Complete By Expires   Increase activity slowly  As directed        Medication List    STOP taking these medications  cetirizine 10 MG tablet  Commonly known as:  ZYRTEC      TAKE these medications       aspirin 81 MG tablet  Take 81 mg by mouth daily.     budesonide-formoterol 160-4.5 MCG/ACT inhaler  Commonly known as:  SYMBICORT   Inhale 2 puffs into the lungs 2 (two) times daily.     famotidine 10 MG chewable tablet  Commonly known as:  PEPCID AC  Chew 10 mg by mouth daily as needed for heartburn.     guaiFENesin 600 MG 12 hr tablet  Commonly known as:  MUCINEX  Take 2 tablets (1,200 mg total) by mouth 2 (two) times daily.     levofloxacin 750 MG tablet  Commonly known as:  LEVAQUIN  Take 1 tablet (750 mg total) by mouth daily.     multivitamin tablet  Take 1 tablet by mouth daily.     naproxen sodium 220 MG tablet  Commonly known as:  ANAPROX  Take 440 mg by mouth 2 (two) times daily as needed (for pain).     tiotropium 18 MCG inhalation capsule  Commonly known as:  SPIRIVA HANDIHALER  Place 1 capsule (18 mcg total) into inhaler and inhale daily.       Allergies  Allergen Reactions  . Sudafed [Pseudoephedrine Hcl]     Urinary retention       Follow-up Information   Follow up with Oliver Barre, MD In 1 week.   Specialties:  Internal Medicine, Radiology   Contact information:   [address] [place] [address] [phone]        The results of significant diagnostics from this hospitalization (including imaging, microbiology, ancillary and laboratory) are listed below for reference.    Significant Diagnostic Studies: Dg Chest 2 View (if Patient Has Fever And/or Copd)  12/25/2013   CLINICAL DATA:  Cough, history emphysema, COPD, coronary artery disease, hyperlipidemia  EXAM: CHEST  2 VIEW  COMPARISON:  10/07/2011  FINDINGS: Normal heart size, mediastinal contours, and pulmonary vascularity.  Emphysematous and bronchitic changes consistent with COPD.  Subsegmental atelectasis versus scarring right lower lobe.  No acute infiltrate, pleural effusion or pneumothorax.  Old bilateral rib fractures.  Dextro convex thoracolumbar scoliosis.  IMPRESSION: COPD changes with subsegmental atelectasis versus scarring and right lower lobe.  No acute abnormalities.   Electronically Signed   By: Ulyses Southward M.D.   On: 12/25/2013 13:11    Microbiology: Recent Results (from the past 240 hour(s))  CULTURE, EXPECTORATED SPUTUM-ASSESSMENT     Status: None   Collection Time    12/26/13  7:14 AM      Result Value Range Status   Specimen Description SPUTUM   Final   Special Requests NONE   Final   Sputum evaluation     Final   Value: MICROSCOPIC FINDINGS SUGGEST THAT THIS SPECIMEN IS NOT REPRESENTATIVE OF LOWER RESPIRATORY SECRETIONS. PLEASE RECOLLECT.     CALLED TO S LEE, RN 12/26/13 0833 BY K SCHULTZ   Report Status 12/26/2013 FINAL   Final     Labs: Basic Metabolic Panel:  Recent Labs Lab 12/25/13 1330 12/26/13 0625  NA 140 140  K 4.4 4.1  CL 101 101  CO2 24 25  GLUCOSE 94 116*  BUN 8 7  CREATININE 0.70 0.63  CALCIUM 9.0 9.0   Liver Function Tests: No results found for this basename: AST, ALT, ALKPHOS, BILITOT, PROT, ALBUMIN,  in the last 168 hours No results found  for this basename: LIPASE, AMYLASE,  in the last 168 hours No results found for this basename: AMMONIA,  in the last 168 hours CBC:  Recent Labs Lab 12/25/13 1330 12/26/13 0625  WBC 11.1* 8.6  HGB 13.5 12.3*  HCT 41.4 37.0*  MCV 87.9 87.9  PLT 448* 417*   Cardiac Enzymes: No results found for this basename: CKTOTAL, CKMB, CKMBINDEX, TROPONINI,  in the last 168 hours BNP: BNP (last 3 results)  Recent Labs  12/25/13 1428  PROBNP 41.2   CBG: No results found for this basename: GLUCAP,  in the last 168 hours     Signed:  Alexx Mcburney A  [facility] 12/26/2013, 10:38 AM

## 2013-12-27 LAB — URINE CULTURE
Colony Count: NO GROWTH
Culture: NO GROWTH

## 2014-01-01 LAB — CULTURE, BLOOD (ROUTINE X 2)
CULTURE: NO GROWTH
Culture: NO GROWTH

## 2014-01-02 ENCOUNTER — Encounter: Payer: Self-pay | Admitting: Internal Medicine

## 2014-01-02 ENCOUNTER — Ambulatory Visit (INDEPENDENT_AMBULATORY_CARE_PROVIDER_SITE_OTHER): Payer: Medicare Other | Admitting: Internal Medicine

## 2014-01-02 VITALS — BP 120/70 | HR 97 | Temp 98.3°F | Ht 70.0 in | Wt 180.0 lb

## 2014-01-02 DIAGNOSIS — Z0000 Encounter for general adult medical examination without abnormal findings: Secondary | ICD-10-CM

## 2014-01-02 DIAGNOSIS — J189 Pneumonia, unspecified organism: Secondary | ICD-10-CM

## 2014-01-02 DIAGNOSIS — J449 Chronic obstructive pulmonary disease, unspecified: Secondary | ICD-10-CM

## 2014-01-02 DIAGNOSIS — J9600 Acute respiratory failure, unspecified whether with hypoxia or hypercapnia: Secondary | ICD-10-CM

## 2014-01-02 DIAGNOSIS — Z23 Encounter for immunization: Secondary | ICD-10-CM

## 2014-01-02 MED ORDER — TIOTROPIUM BROMIDE MONOHYDRATE 18 MCG IN CAPS: 18.0000 ug | ORAL_CAPSULE | Freq: Every day | RESPIRATORY_TRACT | Status: DC

## 2014-01-02 MED ORDER — BUDESONIDE-FORMOTEROL FUMARATE 160-4.5 MCG/ACT IN AERO: 2.0000 | INHALATION_SPRAY | Freq: Two times a day (BID) | RESPIRATORY_TRACT | Status: DC

## 2014-01-02 MED ORDER — ALBUTEROL SULFATE HFA 108 (90 BASE) MCG/ACT IN AERS: 2.0000 | INHALATION_SPRAY | Freq: Four times a day (QID) | RESPIRATORY_TRACT | Status: DC | PRN

## 2014-01-02 NOTE — Progress Notes (Signed)
Pre-visit discussion using our clinic review tool. No additional management support is needed unless otherwise documented below in the visit note.  

## 2014-01-02 NOTE — Patient Instructions (Addendum)
You had the new Prevnar pneumonia shot today Please continue all other medications as before, and refills have been done if requested - all 3 inhalers Please have the pharmacy call with any other refills you may need.  Please return in 3 months, or sooner if needed, with Lab testing done 3-5 days before

## 2014-01-02 NOTE — Progress Notes (Signed)
Subjective:    Patient ID: Kirk Anderson, male    DOB: 01/31/1946, 68 y.o.   MRN: [id number]  HPI  Here to f/u; overall doing ok,  Pt denies chest pain, increased sob or doe, wheezing, orthopnea, PND, increased LE swelling, palpitations, dizziness or syncope.  Pt denies polydipsia, polyuria, or low sugar symptoms such as weakness or confusion improved with po intake.  Pt denies new neurological symptoms such as new headache, or facial or extremity weakness or numbness.   Pt states overall good compliance with meds, has been trying to follow lower cholesterol diet, with wt overall stable,  but little exercise however.  Pt denies fever, wt loss, night sweats, loss of appetite, or other constitutional symptoms Past Medical History  Diagnosis Date  . EMPHYSEMA 10/29/2007  . CORONARY ARTERY DISEASE 05/26/2008    minor by cath 2000  . ALLERGIC RHINITIS 05/26/2008  . GERD 05/26/2008  . COPD (chronic obstructive pulmonary disease)     PFT's 03/24/05 FEV1 1.37 ratio 42, 21% improvement in FVC after B2, DLCO 80 -PFT's 06/29/09 1.19 ratio 29 6% better after B2, DLC0 73 -HFA 75% 06/29/2009 >90% 08/10/2009  . Multiple rib fractures 02/2000  . Hyperlipidemia    Past Surgical History  Procedure Laterality Date  . Appendectomy    . Hemorrhoid surgery      reports that he quit smoking about 23 years ago. His smoking use included Cigarettes. He has a 60 pack-year smoking history. His smokeless tobacco use includes Chew. He reports that he does not drink alcohol or use illicit drugs. family history includes Alcohol abuse in his brother; Aneurysm in his mother; Heart disease in his father. There is no history of Colon cancer. Allergies  Allergen Reactions  . Sudafed [Pseudoephedrine Hcl]     Urinary retention   Current Outpatient Prescriptions on File Prior to Visit  Medication Sig Dispense Refill  . aspirin 81 MG tablet Take 81 mg by mouth daily.        . famotidine (PEPCID AC) 10 MG chewable tablet Chew  10 mg by mouth daily as needed for heartburn.       Marland Kitchen. guaiFENesin (MUCINEX) 600 MG 12 hr tablet Take 2 tablets (1,200 mg total) by mouth 2 (two) times daily.  30 tablet  0  . Multiple Vitamin (MULTIVITAMIN) tablet Take 1 tablet by mouth daily.        . naproxen sodium (ANAPROX) 220 MG tablet Take 440 mg by mouth 2 (two) times daily as needed (for pain).        No current facility-administered medications on file prior to visit.   Review of Systems  Constitutional: Negative for unexpected weight change, or unusual diaphoresis  HENT: Negative for tinnitus.   Eyes: Negative for photophobia and visual disturbance.  Respiratory: Negative for choking and stridor.   Gastrointestinal: Negative for vomiting and blood in stool.  Genitourinary: Negative for hematuria and decreased urine volume.  Musculoskeletal: Negative for acute joint swelling Skin: Negative for color change and wound.  Neurological: Negative for tremors and numbness other than noted  Psychiatric/Behavioral: Negative for decreased concentration or  hyperactivity.       Objective:   Physical Exam BP 120/70  Pulse 97  Temp(Src) 98.3 F (36.8 C) (Oral)  Ht 5\' 10"  (1.778 m)  Wt 180 lb (81.647 kg)  BMI 25.83 kg/m2  SpO2 91% VS noted,  Constitutional: Pt appears well-developed and well-nourished.  HENT: Head: NCAT.  Right Ear: External ear normal.  Left Ear:  External ear normal.  Eyes: Conjunctivae and EOM are normal. Pupils are equal, round, and reactive to light.  Neck: Normal range of motion. Neck supple.  Cardiovascular: Normal rate and regular rhythm.   Pulmonary/Chest: Effort normal and breath sounds normal.  Abd:  Soft, NT, non-distended, + BS Neurological: Pt is alert. Not confused  Skin: Skin is warm. No erythema.  Psychiatric: Pt behavior is normal. Thought content normal.     Assessment & Plan:

## 2014-01-04 NOTE — Assessment & Plan Note (Signed)
Improved, finish antibx,  to f/u any worsening symptoms or concerns

## 2014-01-04 NOTE — Assessment & Plan Note (Signed)
Resolved,  SpO2 Readings from Last 3 Encounters:  01/02/14 91%  12/26/13 95%  12/25/13 85%    to f/u any worsening symptoms or concerns

## 2014-01-04 NOTE — Assessment & Plan Note (Signed)
stable overall by history and exam, recent data reviewed with pt, and pt to continue medical treatment as before,  to f/u any worsening symptoms or concerns SpO2 Readings from Last 3 Encounters:  01/02/14 91%  12/26/13 95%  12/25/13 85%

## 2014-04-02 ENCOUNTER — Ambulatory Visit: Payer: Medicare Other | Admitting: Internal Medicine

## 2014-04-08 ENCOUNTER — Ambulatory Visit (INDEPENDENT_AMBULATORY_CARE_PROVIDER_SITE_OTHER): Payer: Medicare Other | Admitting: Internal Medicine

## 2014-04-08 ENCOUNTER — Encounter: Payer: Self-pay | Admitting: Internal Medicine

## 2014-04-08 ENCOUNTER — Other Ambulatory Visit (INDEPENDENT_AMBULATORY_CARE_PROVIDER_SITE_OTHER): Payer: Medicare Other

## 2014-04-08 VITALS — BP 116/62 | HR 92 | Temp 96.7°F | Wt 176.5 lb

## 2014-04-08 DIAGNOSIS — E785 Hyperlipidemia, unspecified: Secondary | ICD-10-CM

## 2014-04-08 DIAGNOSIS — Z0000 Encounter for general adult medical examination without abnormal findings: Secondary | ICD-10-CM

## 2014-04-08 DIAGNOSIS — R7302 Impaired glucose tolerance (oral): Secondary | ICD-10-CM

## 2014-04-08 DIAGNOSIS — K5730 Diverticulosis of large intestine without perforation or abscess without bleeding: Secondary | ICD-10-CM | POA: Insufficient documentation

## 2014-04-08 DIAGNOSIS — Z125 Encounter for screening for malignant neoplasm of prostate: Secondary | ICD-10-CM

## 2014-04-08 HISTORY — DX: Impaired glucose tolerance (oral): R73.02

## 2014-04-08 LAB — CBC WITH DIFFERENTIAL/PLATELET
BASOS PCT: 0.6 % (ref 0.0–3.0)
Basophils Absolute: 0.1 10*3/uL (ref 0.0–0.1)
EOS PCT: 4.6 % (ref 0.0–5.0)
Eosinophils Absolute: 0.4 10*3/uL (ref 0.0–0.7)
HEMATOCRIT: 44.3 % (ref 39.0–52.0)
Hemoglobin: 15 g/dL (ref 13.0–17.0)
Lymphocytes Relative: 37.3 % (ref 12.0–46.0)
Lymphs Abs: 3.2 10*3/uL (ref 0.7–4.0)
MCHC: 33.8 g/dL (ref 30.0–36.0)
MCV: 88.9 fl (ref 78.0–100.0)
Monocytes Absolute: 0.6 10*3/uL (ref 0.1–1.0)
Monocytes Relative: 7.4 % (ref 3.0–12.0)
NEUTROS PCT: 50.1 % (ref 43.0–77.0)
Neutro Abs: 4.3 10*3/uL (ref 1.4–7.7)
Platelets: 265 10*3/uL (ref 150.0–400.0)
RBC: 4.98 Mil/uL (ref 4.22–5.81)
RDW: 13.7 % (ref 11.5–14.6)
WBC: 8.7 10*3/uL (ref 4.5–10.5)

## 2014-04-08 LAB — BASIC METABOLIC PANEL
BUN: 10 mg/dL (ref 6–23)
CO2: 31 mEq/L (ref 19–32)
CREATININE: 0.9 mg/dL (ref 0.4–1.5)
Calcium: 9.8 mg/dL (ref 8.4–10.5)
Chloride: 102 mEq/L (ref 96–112)
GFR: 93.96 mL/min (ref >60.00)
Glucose, Bld: 98 mg/dL (ref 70–99)
Potassium: 4.8 mEq/L (ref 3.5–5.1)
Sodium: 139 mEq/L (ref 135–145)

## 2014-04-08 LAB — HEPATIC FUNCTION PANEL
ALT: 18 U/L (ref 0–53)
AST: 23 U/L (ref 0–37)
Albumin: 4.1 g/dL (ref 3.5–5.2)
Alkaline Phosphatase: 59 U/L (ref 39–117)
BILIRUBIN TOTAL: 1 mg/dL (ref 0.3–1.2)
Bilirubin, Direct: 0.1 mg/dL (ref 0.0–0.3)
Total Protein: 7.2 g/dL (ref 6.0–8.3)

## 2014-04-08 LAB — LIPID PANEL
CHOL/HDL RATIO: 4
Cholesterol: 202 mg/dL — ABNORMAL HIGH (ref 0–200)
HDL: 47.9 mg/dL (ref >39.00)
LDL Cholesterol: 102 mg/dL — ABNORMAL HIGH (ref 0–99)
Triglycerides: 259 mg/dL — ABNORMAL HIGH (ref 0.0–149.0)
VLDL: 51.8 mg/dL — ABNORMAL HIGH (ref 0.0–40.0)

## 2014-04-08 LAB — URINALYSIS, ROUTINE W REFLEX MICROSCOPIC
BILIRUBIN URINE: NEGATIVE
Hgb urine dipstick: NEGATIVE
Ketones, ur: NEGATIVE
Leukocytes, UA: NEGATIVE
NITRITE: NEGATIVE
PH: 6.5 (ref 5.0–8.0)
SPECIFIC GRAVITY, URINE: 1.015 (ref 1.000–1.030)
Total Protein, Urine: NEGATIVE
Urine Glucose: NEGATIVE
Urobilinogen, UA: 0.2 (ref 0.0–1.0)

## 2014-04-08 LAB — PSA: PSA: 1.77 ng/mL (ref 0.10–4.00)

## 2014-04-08 LAB — TSH: TSH: 1.91 u[IU]/mL (ref 0.35–5.50)

## 2014-04-08 NOTE — Assessment & Plan Note (Signed)

## 2014-04-08 NOTE — Assessment & Plan Note (Signed)
For a1c, to f/u any worsening symptoms or concerns 

## 2014-04-08 NOTE — Progress Notes (Signed)
Subjective:    Patient ID: Kirk Anderson, male    DOB: 05/11/1946, 68 y.o.   MRN: [id number]  HPI  Here for wellness and f/u;  Overall doing ok;  Pt denies CP, worsening SOB, DOE, wheezing, orthopnea, PND, worsening LE edema, palpitations, dizziness or syncope.  Pt denies neurological change such as new headache, facial or extremity weakness.  Pt denies polydipsia, polyuria, or low sugar symptoms. Pt states overall good compliance with treatment and medications, good tolerability, and has been trying to follow lower cholesterol diet.  Pt denies worsening depressive symptoms, suicidal ideation or panic. No fever, night sweats, wt loss, loss of appetite, or other constitutional symptoms.  Pt states good ability with ADL's, has low fall risk, home safety reviewed and adequate, no other significant changes in hearing or vision, and only occasionally active with exercise. No current complaints.  Has been disabled since 2003 with COPD Past Medical History  Diagnosis Date  . EMPHYSEMA 10/29/2007  . CORONARY ARTERY DISEASE 05/26/2008    minor by cath 2000  . ALLERGIC RHINITIS 05/26/2008  . GERD 05/26/2008  . COPD (chronic obstructive pulmonary disease)     PFT's 03/24/05 FEV1 1.37 ratio 42, 21% improvement in FVC after B2, DLCO 80 -PFT's 06/29/09 1.19 ratio 29 6% better after B2, DLC0 73 -HFA 75% 06/29/2009 >90% 08/10/2009  . Multiple rib fractures 02/2000  . Hyperlipidemia   . Impaired glucose tolerance 04/08/2014   Past Surgical History  Procedure Laterality Date  . Appendectomy    . Hemorrhoid surgery      reports that he quit smoking about 23 years ago. His smoking use included Cigarettes. He has a 60 pack-year smoking history. His smokeless tobacco use includes Chew. He reports that he does not drink alcohol or use illicit drugs. family history includes Alcohol abuse in his brother; Aneurysm in his mother; Heart disease in his father. There is no history of Colon cancer. Allergies  Allergen  Reactions  . Sudafed [Pseudoephedrine Hcl]     Urinary retention   Current Outpatient Prescriptions on File Prior to Visit  Medication Sig Dispense Refill  . albuterol (PROVENTIL HFA;VENTOLIN HFA) 108 (90 BASE) MCG/ACT inhaler Inhale 2 puffs into the lungs every 6 (six) hours as needed for wheezing or shortness of breath.  1 Inhaler  11  . aspirin 81 MG tablet Take 81 mg by mouth daily.        . budesonide-formoterol (SYMBICORT) 160-4.5 MCG/ACT inhaler Inhale 2 puffs into the lungs 2 (two) times daily.  3 Inhaler  3  . famotidine (PEPCID AC) 10 MG chewable tablet Chew 10 mg by mouth daily as needed for heartburn.       Marland Kitchen. guaiFENesin (MUCINEX) 600 MG 12 hr tablet Take 2 tablets (1,200 mg total) by mouth 2 (two) times daily.  30 tablet  0  . Multiple Vitamin (MULTIVITAMIN) tablet Take 1 tablet by mouth daily.        . naproxen sodium (ANAPROX) 220 MG tablet Take 440 mg by mouth 2 (two) times daily as needed (for pain).       Marland Kitchen. tiotropium (SPIRIVA HANDIHALER) 18 MCG inhalation capsule Place 1 capsule (18 mcg total) into inhaler and inhale daily.  90 capsule  3   No current facility-administered medications on file prior to visit.   Review of Systems Constitutional: Negative for diaphoresis, activity change, appetite change or unexpected weight change.  HENT: Negative for hearing loss, ear pain, facial swelling, mouth sores and neck stiffness.  Eyes: Negative for pain, redness and visual disturbance.  Respiratory: Negative for shortness of breath and wheezing.   Cardiovascular: Negative for chest pain and palpitations.  Gastrointestinal: Negative for diarrhea, blood in stool, abdominal distention or other pain Genitourinary: Negative for hematuria, flank pain or change in urine volume.  Musculoskeletal: Negative for myalgias and joint swelling.  Skin: Negative for color change and wound.  Neurological: Negative for syncope and numbness. other than noted Hematological: Negative for  adenopathy.  Psychiatric/Behavioral: Negative for hallucinations, self-injury, decreased concentration and agitation.      Objective:   Physical Exam BP 116/62  Pulse 92  Temp(Src) 96.7 F (35.9 C) (Oral)  Wt 176 lb 8 oz (80.06 kg)  SpO2 93% VS noted,  Constitutional: Pt is oriented to person, place, and time. Appears well-developed and well-nourished.  Head: Normocephalic and atraumatic.  Right Ear: External ear normal.  Left Ear: External ear normal.  Nose: Nose normal.  Mouth/Throat: Oropharynx is clear and moist.  Eyes: Conjunctivae and EOM are normal. Pupils are equal, round, and reactive to light.  Neck: Normal range of motion. Neck supple. No JVD present. No tracheal deviation present.  Cardiovascular: Normal rate, regular rhythm, normal heart sounds and intact distal pulses.   Pulmonary/Chest: Effort normal and breath sounds decreased., no rales or wheezing  Abdominal: Soft. Bowel sounds are normal. There is no tenderness. No HSM  Musculoskeletal: Normal range of motion. Exhibits no edema.  Lymphadenopathy:  Has no cervical adenopathy.  Neurological: Pt is alert and oriented to person, place, and time. Pt has normal reflexes. No cranial nerve deficit.  Skin: Skin is warm and dry. No rash noted.  Psychiatric:  Has  normal mood and affect. Behavior is normal.     Assessment & Plan:

## 2014-04-08 NOTE — Patient Instructions (Signed)
Please continue all other medications as before, and refills have been done if requested. Please have the pharmacy call with any other refills you may need.  Please continue your efforts at being more active, low cholesterol diet, and weight control.  You are otherwise up to date with prevention measures today.  Please keep your appointments with your specialists as you  May have planned  Please go to the LAB in the Basement (turn left off the elevator) for the tests to be done today  You will be contacted by phone if any changes need to be made immediately.  Otherwise, you will receive a letter about your results with an explanation, but please check with MyChart first.  Please remember to sign up for MyChart if you have not done so, as this will be important to you in the future with finding out test results, communicating by private email, and scheduling acute appointments online when needed.  Please return in 1 year for your yearly visit, or sooner if needed

## 2014-04-08 NOTE — Progress Notes (Signed)
Pre visit review using our clinic review tool, if applicable. No additional management support is needed unless otherwise documented below in the visit note. 

## 2014-08-27 ENCOUNTER — Ambulatory Visit (INDEPENDENT_AMBULATORY_CARE_PROVIDER_SITE_OTHER): Payer: Medicare Other | Admitting: Internal Medicine

## 2014-08-27 ENCOUNTER — Encounter: Payer: Self-pay | Admitting: Internal Medicine

## 2014-08-27 VITALS — BP 130/80 | HR 105 | Temp 97.9°F | Wt 176.1 lb

## 2014-08-27 DIAGNOSIS — J018 Other acute sinusitis: Secondary | ICD-10-CM

## 2014-08-27 DIAGNOSIS — R7302 Impaired glucose tolerance (oral): Secondary | ICD-10-CM

## 2014-08-27 DIAGNOSIS — R7309 Other abnormal glucose: Secondary | ICD-10-CM

## 2014-08-27 DIAGNOSIS — J019 Acute sinusitis, unspecified: Secondary | ICD-10-CM | POA: Insufficient documentation

## 2014-08-27 DIAGNOSIS — J438 Other emphysema: Secondary | ICD-10-CM

## 2014-08-27 MED ORDER — LEVOFLOXACIN 250 MG PO TABS: 250.0000 mg | ORAL_TABLET | Freq: Every day | ORAL | Status: DC

## 2014-08-27 NOTE — Progress Notes (Signed)
Pre visit review using our clinic review tool, if applicable. No additional management support is needed unless otherwise documented below in the visit note. 

## 2014-08-27 NOTE — Assessment & Plan Note (Signed)
Mild to mod, for antibx course,  to f/u any worsening symptoms or concerns 

## 2014-08-27 NOTE — Progress Notes (Signed)
Subjective:    Patient ID: Kirk Anderson, male    DOB: Apr 23, 1946, 68 y.o.   MRN: [id number]  HPI  Here with 2-3 days acute onset fever, facial pain, pressure, headache, general weakness and malaise, and greenish d/c, with mild ST and cough, but pt denies chest pain, wheezing, increased sob or doe, orthopnea, PND, increased LE swelling, palpitations, dizziness or syncope.   Pt denies polydipsia, polyuria,     Past Medical History  Diagnosis Date  . EMPHYSEMA 10/29/2007  . CORONARY ARTERY DISEASE 05/26/2008    minor by cath 2000  . ALLERGIC RHINITIS 05/26/2008  . GERD 05/26/2008  . COPD (chronic obstructive pulmonary disease)     PFT's 03/24/05 FEV1 1.37 ratio 42, 21% improvement in FVC after B2, DLCO 80 -PFT's 06/29/09 1.19 ratio 29 6% better after B2, DLC0 73 -HFA 75% 06/29/2009 >90% 08/10/2009  . Multiple rib fractures 02/2000  . Hyperlipidemia   . Impaired glucose tolerance 04/08/2014   Past Surgical History  Procedure Laterality Date  . Appendectomy    . Hemorrhoid surgery      reports that he quit smoking about 23 years ago. His smoking use included Cigarettes. He has a 60 pack-year smoking history. His smokeless tobacco use includes Chew. He reports that he does not drink alcohol or use illicit drugs. family history includes Alcohol abuse in his brother; Aneurysm in his mother; Heart disease in his father. There is no history of Colon cancer. Allergies  Allergen Reactions  . Sudafed [Pseudoephedrine Hcl]     Urinary retention   Current Outpatient Prescriptions on File Prior to Visit  Medication Sig Dispense Refill  . albuterol (PROVENTIL HFA;VENTOLIN HFA) 108 (90 BASE) MCG/ACT inhaler Inhale 2 puffs into the lungs every 6 (six) hours as needed for wheezing or shortness of breath.  1 Inhaler  11  . aspirin 81 MG tablet Take 81 mg by mouth daily.        . budesonide-formoterol (SYMBICORT) 160-4.5 MCG/ACT inhaler Inhale 2 puffs into the lungs 2 (two) times daily.  3 Inhaler  3  .  famotidine (PEPCID AC) 10 MG chewable tablet Chew 10 mg by mouth daily as needed for heartburn.       Marland Kitchen guaiFENesin (MUCINEX) 600 MG 12 hr tablet Take 2 tablets (1,200 mg total) by mouth 2 (two) times daily.  30 tablet  0  . Multiple Vitamin (MULTIVITAMIN) tablet Take 1 tablet by mouth daily.        . naproxen sodium (ANAPROX) 220 MG tablet Take 440 mg by mouth 2 (two) times daily as needed (for pain).       Marland Kitchen tiotropium (SPIRIVA HANDIHALER) 18 MCG inhalation capsule Place 1 capsule (18 mcg total) into inhaler and inhale daily.  90 capsule  3   No current facility-administered medications on file prior to visit.   Review of Systems  Constitutional: Negative for unusual diaphoresis or other sweats  HENT: Negative for ringing in ear Eyes: Negative for double vision or worsening visual disturbance.  Respiratory: Negative for choking and stridor.   Gastrointestinal: Negative for vomiting or other signifcant bowel change Genitourinary: Negative for hematuria or decreased urine volume.  Musculoskeletal: Negative for other MSK pain or swelling Skin: Negative for color change and worsening wound.  Neurological: Negative for tremors and numbness other than noted  Psychiatric/Behavioral: Negative for decreased concentration or agitation other than above       Objective:   Physical Exam BP 130/80  Pulse 105  Temp(Src) 97.9 F (  36.6 C) (Oral)  Wt 176 lb 2 oz (79.89 kg)  SpO2 91% VS noted,  Constitutional: Pt appears well-developed, well-nourished.  HENT: Head: NCAT.  Right Ear: External ear normal.  Left Ear: External ear normal.  Eyes: . Pupils are equal, round, and reactive to light. Conjunctivae and EOM are normal Neck: Normal range of motion. Neck supple.  Bilat tm's with mild erythema.  Max sinus areas mild tender.  Pharynx with mild erythema, no exudate Cardiovascular: Normal rate and regular rhythm.   Pulmonary/Chest: Effort normal and breath sounds decresaed, no rales or wheezing    Neurological: Pt is alert. Not confused , motor grossly intact Skin: Skin is warm. No rash Psychiatric: Pt behavior is normal. No agitation.      Assessment & Plan:

## 2014-08-27 NOTE — Assessment & Plan Note (Signed)
stable overall by history and exam, recent data reviewed with pt, and pt to continue medical treatment as before,  to f/u any worsening symptoms or concerns SpO2 Readings from Last 3 Encounters:  08/27/14 91%  04/08/14 93%  01/02/14 91%

## 2014-08-27 NOTE — Assessment & Plan Note (Signed)
stable overall by history and exam, recent data reviewed with pt, and pt to continue medical treatment as before,  to f/u any worsening symptoms or concerns Lab Results  Component Value Date   WBC 8.7 04/08/2014   HGB 15.0 04/08/2014   HCT 44.3 04/08/2014   PLT 265.0 04/08/2014   GLUCOSE 98 04/08/2014   CHOL 202* 04/08/2014   TRIG 259.0* 04/08/2014   HDL 47.90 04/08/2014   LDLDIRECT 126.9 02/12/2013   LDLCALC 102* 04/08/2014   ALT 18 04/08/2014   AST 23 04/08/2014   NA 139 04/08/2014   K 4.8 04/08/2014   CL 102 04/08/2014   CREATININE 0.9 04/08/2014   BUN 10 04/08/2014   CO2 31 04/08/2014   TSH 1.91 04/08/2014   PSA 1.77 04/08/2014

## 2014-08-27 NOTE — Patient Instructions (Signed)
Please take all new medication as prescribed - the antibiotic  Please continue all other medications as before, and refills have been done if requested.  Please have the pharmacy call with any other refills you may need.  Please keep your appointments with your specialists as you may have planned   

## 2014-10-03 ENCOUNTER — Other Ambulatory Visit: Payer: Self-pay

## 2015-01-22 DIAGNOSIS — H25013 Cortical age-related cataract, bilateral: Secondary | ICD-10-CM | POA: Diagnosis not present

## 2015-02-05 ENCOUNTER — Other Ambulatory Visit: Payer: Self-pay | Admitting: Internal Medicine

## 2015-03-31 ENCOUNTER — Encounter: Payer: Self-pay | Admitting: Internal Medicine

## 2015-03-31 ENCOUNTER — Ambulatory Visit (INDEPENDENT_AMBULATORY_CARE_PROVIDER_SITE_OTHER): Payer: Medicare Other | Admitting: Internal Medicine

## 2015-03-31 ENCOUNTER — Other Ambulatory Visit (INDEPENDENT_AMBULATORY_CARE_PROVIDER_SITE_OTHER): Payer: Medicare Other

## 2015-03-31 VITALS — BP 120/70 | HR 81 | Temp 97.7°F | Resp 18 | Ht 70.0 in | Wt 176.6 lb

## 2015-03-31 DIAGNOSIS — R29810 Facial weakness: Secondary | ICD-10-CM | POA: Insufficient documentation

## 2015-03-31 DIAGNOSIS — R7989 Other specified abnormal findings of blood chemistry: Secondary | ICD-10-CM | POA: Diagnosis not present

## 2015-03-31 DIAGNOSIS — E785 Hyperlipidemia, unspecified: Secondary | ICD-10-CM

## 2015-03-31 DIAGNOSIS — R7302 Impaired glucose tolerance (oral): Secondary | ICD-10-CM | POA: Diagnosis not present

## 2015-03-31 DIAGNOSIS — Z125 Encounter for screening for malignant neoplasm of prostate: Secondary | ICD-10-CM | POA: Diagnosis not present

## 2015-03-31 DIAGNOSIS — Z0000 Encounter for general adult medical examination without abnormal findings: Secondary | ICD-10-CM

## 2015-03-31 LAB — LIPID PANEL
CHOL/HDL RATIO: 5
Cholesterol: 221 mg/dL — ABNORMAL HIGH (ref 0–200)
HDL: 47.3 mg/dL (ref >39.00)
NONHDL: 173.7
Triglycerides: 247 mg/dL — ABNORMAL HIGH (ref 0.0–149.0)
VLDL: 49.4 mg/dL — AB (ref 0.0–40.0)

## 2015-03-31 LAB — URINALYSIS, ROUTINE W REFLEX MICROSCOPIC
BILIRUBIN URINE: NEGATIVE
Hgb urine dipstick: NEGATIVE
KETONES UR: NEGATIVE
Leukocytes, UA: NEGATIVE
NITRITE: NEGATIVE
RBC / HPF: NONE SEEN (ref 0–?)
Total Protein, Urine: NEGATIVE
Urine Glucose: NEGATIVE
Urobilinogen, UA: 0.2 (ref 0.0–1.0)
WBC, UA: NONE SEEN (ref 0–?)
pH: 7 (ref 5.0–8.0)

## 2015-03-31 LAB — HEPATIC FUNCTION PANEL
ALK PHOS: 61 U/L (ref 39–117)
ALT: 16 U/L (ref 0–53)
AST: 20 U/L (ref 0–37)
Albumin: 4.3 g/dL (ref 3.5–5.2)
Bilirubin, Direct: 0.1 mg/dL (ref 0.0–0.3)
TOTAL PROTEIN: 7.2 g/dL (ref 6.0–8.3)
Total Bilirubin: 0.9 mg/dL (ref 0.2–1.2)

## 2015-03-31 LAB — CBC WITH DIFFERENTIAL/PLATELET
Basophils Absolute: 0 10*3/uL (ref 0.0–0.1)
Basophils Relative: 0.6 % (ref 0.0–3.0)
Eosinophils Absolute: 0.5 10*3/uL (ref 0.0–0.7)
Eosinophils Relative: 6.5 % — ABNORMAL HIGH (ref 0.0–5.0)
HEMATOCRIT: 47 % (ref 39.0–52.0)
HEMOGLOBIN: 16.1 g/dL (ref 13.0–17.0)
LYMPHS ABS: 2.9 10*3/uL (ref 0.7–4.0)
Lymphocytes Relative: 38.5 % (ref 12.0–46.0)
MCHC: 34.2 g/dL (ref 30.0–36.0)
MCV: 88.5 fl (ref 78.0–100.0)
MONOS PCT: 6.4 % (ref 3.0–12.0)
Monocytes Absolute: 0.5 10*3/uL (ref 0.1–1.0)
NEUTROS ABS: 3.6 10*3/uL (ref 1.4–7.7)
Neutrophils Relative %: 48 % (ref 43.0–77.0)
Platelets: 257 10*3/uL (ref 150.0–400.0)
RBC: 5.31 Mil/uL (ref 4.22–5.81)
RDW: 13.4 % (ref 11.5–15.5)
WBC: 7.6 10*3/uL (ref 4.0–10.5)

## 2015-03-31 LAB — BASIC METABOLIC PANEL
BUN: 10 mg/dL (ref 6–23)
CHLORIDE: 102 meq/L (ref 96–112)
CO2: 31 mEq/L (ref 19–32)
CREATININE: 0.82 mg/dL (ref 0.40–1.50)
Calcium: 9.9 mg/dL (ref 8.4–10.5)
GFR: 98.98 mL/min (ref >60.00)
Glucose, Bld: 94 mg/dL (ref 70–99)
Potassium: 4.5 mEq/L (ref 3.5–5.1)
Sodium: 138 mEq/L (ref 135–145)

## 2015-03-31 LAB — HEMOGLOBIN A1C: HEMOGLOBIN A1C: 5.6 % (ref 4.6–6.5)

## 2015-03-31 LAB — LDL CHOLESTEROL, DIRECT: Direct LDL: 135 mg/dL

## 2015-03-31 LAB — PSA: PSA: 1.88 ng/mL (ref 0.10–4.00)

## 2015-03-31 LAB — TSH: TSH: 2.6 u[IU]/mL (ref 0.35–4.50)

## 2015-03-31 NOTE — Progress Notes (Signed)
Pre visit review using our clinic review tool, if applicable. No additional management support is needed unless otherwise documented below in the visit note. 

## 2015-03-31 NOTE — Assessment & Plan Note (Signed)
1 wk, persistent no change, assoc with left HA but no other symptoms, exam c/w left facial weakness only, ? Bells palsy vs stroke, ECG reviewed as per emr, and for MRI head, and re-start asa 81 qd, has been statin intolerant in past

## 2015-03-31 NOTE — Assessment & Plan Note (Signed)

## 2015-03-31 NOTE — Assessment & Plan Note (Signed)
stable overall by history and exam, recent data reviewed with pt, and pt to continue medical treatment as before,  to f/u any worsening symptoms or concerns Lab Results  Component Value Date   WBC 8.7 04/08/2014   HGB 15.0 04/08/2014   HCT 44.3 04/08/2014   PLT 265.0 04/08/2014   GLUCOSE 98 04/08/2014   CHOL 202* 04/08/2014   TRIG 259.0* 04/08/2014   HDL 47.90 04/08/2014   LDLDIRECT 126.9 02/12/2013   LDLCALC 102* 04/08/2014   ALT 18 04/08/2014   AST 23 04/08/2014   NA 139 04/08/2014   K 4.8 04/08/2014   CL 102 04/08/2014   CREATININE 0.9 04/08/2014   BUN 10 04/08/2014   CO2 31 04/08/2014   TSH 1.91 04/08/2014   PSA 1.77 04/08/2014

## 2015-03-31 NOTE — Addendum Note (Signed)
Addended by: Maurene CapesPOTTS, Artez Regis M on: 03/31/2015 11:48 AM   Modules accepted: Orders

## 2015-03-31 NOTE — Patient Instructions (Addendum)
Please re-start the Aspirin as you have take before  Please continue all other medications as before, and refills have been done if requested.  Please have the pharmacy call with any other refills you may need.  Please continue your efforts at being more active, low cholesterol diet, and weight control.  You are otherwise up to date with prevention measures today.  Please keep your appointments with your specialists as you may have planned  You will be contacted regarding the referral for: [facility]  Your EKG was OK today  Please go to the LAB in the Basement (turn left off the elevator) for the tests to be done today  You will be contacted by phone if any changes need to be made immediately.  Otherwise, you will receive a letter about your results with an explanation, but please check with MyChart first.   Please remember to sign up for MyChart if you have not done so, as this will be important to you in the future with finding out test results, communicating by private email, and scheduling acute appointments online when needed.  Please return in 6 months, or sooner if needed, with Lab testing done 3-5 days before

## 2015-03-31 NOTE — Progress Notes (Signed)
Subjective:    Patient ID: Kirk Anderson, male    DOB: 10-Apr-1946, 69 y.o.   MRN: [id number]  HPI  Here for wellness and f/u;  Overall doing ok;  Pt denies Chest pain, worsening SOB, DOE, wheezing, orthopnea, PND, worsening LE edema, palpitations, dizziness or syncope.  Pt has had incidently neurological change with 1 wk new left difffuse parietal area headache, assoc with left weakness - ? Bells palsy vs other. No drooling but hard to do same mouth movements as before such as spit. No aphasia or swallow issue. Stopped his ASA to take motrin instead prn the past wk.   Pt denies polydipsia, polyuria, or low sugar symptoms. Pt states overall good compliance with treatment and medications, good tolerability, and has been trying to follow appropriate diet.  Pt denies worsening depressive symptoms, suicidal ideation or panic. No fever, night sweats, wt loss, loss of appetite, or other constitutional symptoms.  Pt states good ability with ADL's, has low fall risk, home safety reviewed and adequate, no other significant changes in hearing or vision, and only occasionally active with exercise.  Pt continues to have recurring left LBP without change in severity recently, no bowel or bladder change, fever, wt loss,  worsening LE pain/numbness/weakness, gait change or falls.  Note: has been statin intoleratn in past Past Medical History  Diagnosis Date  . EMPHYSEMA 10/29/2007  . CORONARY ARTERY DISEASE 05/26/2008    minor by cath 2000  . ALLERGIC RHINITIS 05/26/2008  . GERD 05/26/2008  . COPD (chronic obstructive pulmonary disease)     PFT's 03/24/05 FEV1 1.37 ratio 42, 21% improvement in FVC after B2, DLCO 80 -PFT's 06/29/09 1.19 ratio 29 6% better after B2, DLC0 73 -HFA 75% 06/29/2009 >90% 08/10/2009  . Multiple rib fractures 02/2000  . Hyperlipidemia   . Impaired glucose tolerance 04/08/2014   Past Surgical History  Procedure Laterality Date  . Appendectomy    . Hemorrhoid surgery      reports that he  quit smoking about 24 years ago. His smoking use included Cigarettes. He has a 60 pack-year smoking history. His smokeless tobacco use includes Chew. He reports that he does not drink alcohol or use illicit drugs. family history includes Alcohol abuse in his brother; Aneurysm in his mother; Heart disease in his father. There is no history of Colon cancer. Allergies  Allergen Reactions  . Sudafed [Pseudoephedrine Hcl]     Urinary retention   Current Outpatient Prescriptions on File Prior to Visit  Medication Sig Dispense Refill  . albuterol (PROVENTIL HFA;VENTOLIN HFA) 108 (90 BASE) MCG/ACT inhaler Inhale 2 puffs into the lungs every 6 (six) hours as needed for wheezing or shortness of breath. 1 Inhaler 11  . aspirin 81 MG tablet Take 81 mg by mouth daily.      . famotidine (PEPCID AC) 10 MG chewable tablet Chew 10 mg by mouth daily as needed for heartburn.     . Multiple Vitamin (MULTIVITAMIN) tablet Take 1 tablet by mouth daily.      . naproxen sodium (ANAPROX) 220 MG tablet Take 440 mg by mouth 2 (two) times daily as needed (for pain).     . SPIRIVA HANDIHALER 18 MCG inhalation capsule PLACE 1 CAPSULE INTO INHALER AND INHALE DAILY. 90 capsule 3  . SYMBICORT 160-4.5 MCG/ACT inhaler INHALE 2 PUFFS INTO THE LUNGS 2 (TWO) TIMES DAILY. 30.6 g 3   No current facility-administered medications on file prior to visit.   Review of Systems  Constitutional: Negative  for unusual diaphoresis or night sweats HENT: Negative for ringing in ear or discharge Eyes: Negative for double vision or worsening visual disturbance.  Respiratory: Negative for choking and stridor.   Gastrointestinal: Negative for vomiting or other signifcant bowel change Genitourinary: Negative for hematuria or change in urine volume.  Musculoskeletal: Negative for other MSK pain or swelling Skin: Negative for color change and worsening wound.  Neurological: Negative for tremors and numbness other than noted    Psychiatric/Behavioral: Negative for decreased concentration or agitation other than above       Objective:   Physical Exam BP 120/70 mmHg  Pulse 81  Temp(Src) 97.7 F (36.5 C) (Oral)  Resp 18  Ht 5\' 10"  (1.778 m)  Wt 176 lb 9.6 oz (80.105 kg)  BMI 25.34 kg/m2  SpO2 92% VS noted,  Constitutional: Pt appears in no significant distress HENT: Head: NCAT.  Right Ear: External ear normal.  Left Ear: External ear normal.  Eyes: . Pupils are equal, round, and reactive to light. Conjunctivae and EOM are normal Neck: Normal range of motion. Neck supple.  Cardiovascular: Normal rate and regular rhythm.   Pulmonary/Chest: Effort normal and breath sounds without rales or wheezing.  Abd:  Soft, NT, ND, + BS Neurological: Pt is alert. Not confused , motor 5/5 intact, cn 2-12 intact except for mild left facial nerve weakness,  Skin: Skin is warm. No rash, no LE edema Psychiatric: Pt behavior is normal. No agitation.     Assessment & Plan:

## 2015-04-23 ENCOUNTER — Ambulatory Visit
Admission: RE | Admit: 2015-04-23 | Discharge: 2015-04-23 | Disposition: A | Discharge status: Home / Self Care | Payer: Medicare Other | Source: Ambulatory Visit | Attending: Internal Medicine | Admitting: Internal Medicine

## 2015-04-23 DIAGNOSIS — R29810 Facial weakness: Secondary | ICD-10-CM | POA: Diagnosis not present

## 2015-04-23 DIAGNOSIS — I6782 Cerebral ischemia: Secondary | ICD-10-CM | POA: Diagnosis not present

## 2015-04-23 DIAGNOSIS — J329 Chronic sinusitis, unspecified: Secondary | ICD-10-CM | POA: Diagnosis not present

## 2015-05-25 ENCOUNTER — Other Ambulatory Visit: Payer: Self-pay | Admitting: Internal Medicine

## 2015-09-01 ENCOUNTER — Institutional Professional Consult (permissible substitution): Payer: Self-pay | Admitting: Internal Medicine

## 2015-09-04 ENCOUNTER — Ambulatory Visit (INDEPENDENT_AMBULATORY_CARE_PROVIDER_SITE_OTHER)
Admission: RE | Admit: 2015-09-04 | Discharge: 2015-09-04 | Disposition: A | Discharge status: Home / Self Care | Payer: Medicare Other | Source: Ambulatory Visit | Attending: Internal Medicine | Admitting: Internal Medicine

## 2015-09-04 ENCOUNTER — Encounter: Payer: Self-pay | Admitting: Internal Medicine

## 2015-09-04 ENCOUNTER — Ambulatory Visit (INDEPENDENT_AMBULATORY_CARE_PROVIDER_SITE_OTHER): Payer: Medicare Other | Admitting: Internal Medicine

## 2015-09-04 ENCOUNTER — Other Ambulatory Visit (INDEPENDENT_AMBULATORY_CARE_PROVIDER_SITE_OTHER): Payer: Medicare Other

## 2015-09-04 VITALS — BP 154/94 | HR 85 | Ht 70.0 in | Wt 177.8 lb

## 2015-09-04 DIAGNOSIS — R0600 Dyspnea, unspecified: Secondary | ICD-10-CM

## 2015-09-04 DIAGNOSIS — I10 Essential (primary) hypertension: Secondary | ICD-10-CM | POA: Diagnosis not present

## 2015-09-04 DIAGNOSIS — J449 Chronic obstructive pulmonary disease, unspecified: Secondary | ICD-10-CM

## 2015-09-04 LAB — BASIC METABOLIC PANEL
BUN: 11 mg/dL (ref 6–23)
CO2: 30 mEq/L (ref 19–32)
Calcium: 9.6 mg/dL (ref 8.4–10.5)
Chloride: 102 mEq/L (ref 96–112)
Creatinine, Ser: 0.81 mg/dL (ref 0.40–1.50)
GFR: 100.27 mL/min (ref >60.00)
Glucose, Bld: 90 mg/dL (ref 70–99)
Potassium: 4.6 mEq/L (ref 3.5–5.1)
Sodium: 139 mEq/L (ref 135–145)

## 2015-09-04 LAB — BRAIN NATRIURETIC PEPTIDE: PRO B NATRI PEPTIDE: 16 pg/mL (ref 0.0–100.0)

## 2015-09-04 LAB — CBC WITH DIFFERENTIAL/PLATELET
Basophils Absolute: 0 10*3/uL (ref 0.0–0.1)
Basophils Relative: 0.5 % (ref 0.0–3.0)
EOS ABS: 0.7 10*3/uL (ref 0.0–0.7)
EOS PCT: 8.7 % — AB (ref 0.0–5.0)
HCT: 45.2 % (ref 39.0–52.0)
HEMOGLOBIN: 15.2 g/dL (ref 13.0–17.0)
Lymphocytes Relative: 33.8 % (ref 12.0–46.0)
Lymphs Abs: 2.9 10*3/uL (ref 0.7–4.0)
MCHC: 33.6 g/dL (ref 30.0–36.0)
MCV: 88.3 fl (ref 78.0–100.0)
MONO ABS: 0.6 10*3/uL (ref 0.1–1.0)
Monocytes Relative: 7.5 % (ref 3.0–12.0)
Neutro Abs: 4.2 10*3/uL (ref 1.4–7.7)
Neutrophils Relative %: 49.5 % (ref 43.0–77.0)
Platelets: 228 10*3/uL (ref 150.0–400.0)
RBC: 5.13 Mil/uL (ref 4.22–5.81)
RDW: 13.4 % (ref 11.5–15.5)
WBC: 8.5 10*3/uL (ref 4.0–10.5)

## 2015-09-04 LAB — TSH: TSH: 1.9 u[IU]/mL (ref 0.35–4.50)

## 2015-09-04 MED ORDER — NEBIVOLOL HCL 5 MG PO TABS: 5.0000 mg | ORAL_TABLET | Freq: Every day | ORAL | Status: DC

## 2015-09-04 NOTE — Progress Notes (Signed)
Subjective:     Patient ID: Kirk Anderson, male   DOB: 1946/10/01   MRN: [id number]     Brief patient profile:  69yowm quit smoking 1992 with COPD FEV1 42% 03/24/05 c/w GOLD III criteria    History of Present Illness  June 29, 2009 ov with pft's no improvment doe and very hoarse, lots of throat clearing, no excess mucus imp was upper airway instability ? advair irriating  rec Stop advair  Start Symbicort 160 2 puffs first thing in am and 2 puffs again in pm about 12 hours later      08/26/2011 f/u ov/Wert cc doe x large aisle like [employer],  freq use of combivent when humid. rec Start spiriva one capsule each am You should see gradual improvement in your breathing and much less need for combivent     10/07/2011 f/u ov/Wert cc breathing much better, no longer needing combivent at all attributed to cooler weather but only happened p rx with spiriva added to symbicort.  rec Try just use symbicort 160 2 puffs every 12 hours if needed    04/11/2012 f/u ov/Wert cc doe about the same, only with exertion,  on maint spiriva and sometimes symbicort, minimal mostly dry cough. No daytime saba need at all, no variabilty in symptoms but hasn't gotten hot yet so too soon to tell for sure. No real limiting sob with desired activities. rec F/u prn    09/04/2015  New pt/ re-establish care  GOLD III copd Cc worse breathing on spiriva dpi and symb 160 2bid  Chief Complaint  Patient presents with  . Pulmonary Consult    Pt seen last in April 2013. He c/o SOB and fatigue for the past several months. He states that he gets SOB with any exertion. He is using proair approx 5 x per day. He has some cough in the am- prod at times with clear sputum. He states when he wakes up in the am's he feels tingling all over and wonders if he need so2 with sleep.    gradually worse since hot weather early July 2016 with doe x anything more than slow walk short distance = MMRC3 = can't walk 100 yards even at a slow pace at  a flat grade s stopping due to sob   Some better with sab, min assoc cough esp in ams when tends to use a lot of saba to get his day going   No obvious day to day or daytime variability or assoc chronic cough or cp or chest tightness, subjective wheeze or overt sinus or hb symptoms. No unusual exp hx or h/o childhood pna/ asthma or knowledge of premature birth.  Sleeping ok without nocturnal   exacerbation  of respiratory  c/o's or need for noct saba. Also denies any obvious fluctuation of symptoms with weather or environmental changes or other aggravating or alleviating factors except as outlined above   Current Medications, Allergies, Complete Past Medical History, Past Surgical History, Family History, and Social History were reviewed in [employer] record.  ROS  The following are not active complaints unless bolded sore throat, dysphagia, dental problems, itching, sneezing,  nasal congestion or excess/ purulent secretions, ear ache,   fever, chills, sweats, unintended wt loss, classically pleuritic or exertional cp, hemoptysis,  orthopnea pnd or leg swelling, presyncope, palpitations, abdominal pain, anorexia, nausea, vomiting, diarrhea  or change in bowel or bladder habits, change in stools or urine, dysuria,hematuria,  rash, arthralgias, visual complaints, headache, numbness, weakness  or ataxia or problems with walking or coordination,  change in mood/affect or memory.             Past Medical History:  EMPHYSEMA (ICD-492.8)  CHRONIC OBSTRUCTIVE PULMONARY DISEASE, MODERATE (ICD-496)  GERD  Coronary artery disease - minor by cath 2000  Allergic rhinitis  Multiple rib fxs 3/20011          Objective:   Physical Exam   Amb wm nad minimally hoarse - edentulous  wt 194 April 16, 2009 > 188 June 29, 2009 >   183 08/26/2011 > 10/07/2011  183 > 04/11/2012  180 > 178 09/04/2015   HEENT mild turbinate edema. Oropharynx no thrush or excess pnd or cobblestoning. No JVD  or cervical adenopathy. Mild accessory muscle hypertrophy. Trachea midline, nl thryroid. Chest was hyperinflated by percussion with diminished breath sounds and moderate increased exp time without wheeze. Hoover sign positive at mid inspiration. Regular rate and rhythm without murmur gallop or rub or increase P2 or edema. Abd: no hsm, nl excursion. Ext warm without cyanosis or clubbing.    CXR PA and Lateral:   09/04/2015 :     I personally reviewed images and agree with radiology impression as follows:    No active cardiopulmonary disease.    Labs ordered/ reviewed:   Lab 09/04/15 1126  NA 139  K 4.6  CL 102  CO2 30  BUN 11  CREATININE 0.81  GLUCOSE 90    Lab 09/04/15 1126  HGB 15.2  HCT 45.2  WBC 8.5  PLT 228.0     Lab Results  Component Value Date   TSH 1.90 09/04/2015     Lab Results  Component Value Date   PROBNP 16.0 09/04/2015                   Assessment:

## 2015-09-04 NOTE — Progress Notes (Signed)
Quick Note:  Spoke with pt and notified of results per Dr. Wert. Pt verbalized understanding and denied any questions.  ______ 

## 2015-09-04 NOTE — Patient Instructions (Signed)
Add bystolic 5 mg daily   Please remember to go to the lab and x-ray department downstairs for your tests - we will call you with the results when they are available.  Stop spiriva and start tudorza one twice daily after symbicort  And if happy fill the prescription, if not resume the spiriva and let us call you in the respimat form of spiriva to purchase before your next visit  Please schedule a follow up office visit in 4 weeks, sooner if needed

## 2015-09-06 ENCOUNTER — Encounter: Payer: Self-pay | Admitting: Internal Medicine

## 2015-09-06 NOTE — Assessment & Plan Note (Signed)
Strongly prefer in this setting: Bystolic, the most beta -1  selective Beta blocker available in sample form, with bisoprolol the most selective generic choice  on the market.   Try bystolic 5 mg daily

## 2015-09-06 NOTE — Assessment & Plan Note (Signed)
-   09/04/2015  Walked RA x 3 laps @ 185 ft each stopped due to End of study, nl pace, no sob or desat  / somewhat unsteady gait   May benefiit from pulmonary rehab, will discuss at f/u ov/ no need for 02 at this point

## 2015-09-17 ENCOUNTER — Telehealth: Payer: Self-pay | Admitting: Internal Medicine

## 2015-09-17 NOTE — Telephone Encounter (Signed)
LMTCB

## 2015-09-18 MED ORDER — TIOTROPIUM BROMIDE MONOHYDRATE 2.5 MCG/ACT IN AERS
2.0000 | INHALATION_SPRAY | Freq: Every day | RESPIRATORY_TRACT | Status: DC
Start: 2015-09-18 — End: 2016-04-19

## 2015-09-18 NOTE — Telephone Encounter (Signed)
Per 09/04/15 OV: Patient Instructions       Add bystolic 5 mg daily  Please remember to go to the lab and x-ray department downstairs for your tests - we will call you with the results when they are available. Stop spiriva and start tudorza one twice daily after symbicort  And if happy fill the prescription, if not resume the spiriva and let us call you in the respimat form of spiriva to purchase before your next visit Please schedule a follow up office visit in 4 weeks, sooner if needed    ---  Spoke with pt. He did not feel the tudorza helped. He wants to try the spiriva resp as stated by MW. RX sent in. Nothing further needed

## 2015-10-02 ENCOUNTER — Ambulatory Visit (INDEPENDENT_AMBULATORY_CARE_PROVIDER_SITE_OTHER): Payer: Medicare Other | Admitting: Internal Medicine

## 2015-10-02 ENCOUNTER — Encounter: Payer: Self-pay | Admitting: Internal Medicine

## 2015-10-02 VITALS — BP 142/82 | HR 72 | Ht 70.0 in | Wt 181.6 lb

## 2015-10-02 DIAGNOSIS — I10 Essential (primary) hypertension: Secondary | ICD-10-CM

## 2015-10-02 DIAGNOSIS — J449 Chronic obstructive pulmonary disease, unspecified: Secondary | ICD-10-CM | POA: Diagnosis not present

## 2015-10-02 MED ORDER — BISOPROLOL FUMARATE 5 MG PO TABS: 5.0000 mg | ORAL_TABLET | Freq: Every day | ORAL | Status: DC

## 2015-10-02 NOTE — Assessment & Plan Note (Signed)
Baseline severe copd s/p remote smoking cessation and gradually worse over sev months ? Why. DDX of  difficult airways management all start with A and  include Adherence, Ace Inhibitors, Acid Reflux, Active Sinus Disease, Alpha 1 Antitripsin deficiency, Anxiety masquerading as Airways dz,  ABPA,  allergy(esp in young), Aspiration (esp in elderly), Adverse effects of meds,  Active smokers, A bunch of PE's (a small clot burden can't cause this syndrome unless there is already severe underlying pulm or vascular dz with poor reserve) plus two Bs  = Bronchiectasis and Beta blocker use..and one C= CHF   Adherence is always the initial "prime suspect" and is a multilayered concern that requires a "trust but verify" approach in every patient - starting with knowing how to use medications, especially inhalers, correctly, keeping up with refills and understanding the fundamental difference between maintenance and prns vs those medications only taken for a very short course and then stopped and not refilled.  - over use of saba noted - The proper method of use, as well as anticipated side effects, of a metered-dose inhaler are discussed and demonstrated to the patient. Improved effectiveness after extensive coaching during this visit to a level of approximately  90% from a baseline of 50%   ? Adverse effects from dpi, esp spiriva dpi  > try tudorza one bid and if not better then change to spiriva respimat before next ov  > chf > ruled out by bnp < 100 but could still have diastolic dysfunction esp with activity > see hbp  I had an extended discussion with the patient reviewing all relevant studies completed to date and  lasting 35 min   Each maintenance medication was reviewed in detail including most importantly the difference between maintenance and prns and under what circumstances the prns are to be triggered using an action plan format that is not reflected in the computer generated alphabetically organized  AVS.    Please see instructions for details which were reviewed in writing and the patient given a copy highlighting the part that I personally wrote and discussed at today's ov.

## 2015-10-02 NOTE — Progress Notes (Signed)
Subjective:     Patient ID: Kirk Anderson, male   DOB: August 15, 1946   MRN: [id number]     Brief patient profile:  69yowm quit smoking 1992 with COPD FEV1 42% 03/24/05 c/w GOLD III criteria    History of Present Illness  June 29, 2009 ov with pft's no improvment doe and very hoarse, lots of throat clearing, no excess mucus imp was upper airway instability ? advair irriating  rec Stop advair  Start Symbicort 160 2 puffs first thing in am and 2 puffs again in pm about 12 hours later      08/26/2011 f/u ov/Luetta Piazza cc doe x large aisle like [employer],  freq use of combivent when humid. rec Start spiriva one capsule each am You should see gradual improvement in your breathing and much less need for combivent     10/07/2011 f/u ov/Cayenne Breault cc breathing much better, no longer needing combivent at all attributed to cooler weather but only happened p rx with spiriva added to symbicort.  rec Try just use symbicort 160 2 puffs every 12 hours if needed    04/11/2012 f/u ov/Kenidi Elenbaas cc doe about the same, only with exertion,  on maint spiriva and sometimes symbicort, minimal mostly dry cough. No daytime saba need at all, no variabilty in symptoms but hasn't gotten hot yet so too soon to tell for sure. No real limiting sob with desired activities. rec F/u prn    09/04/2015  New pt/ re-establish care  GOLD III copd cc worse breathing on spiriva dpi and symb 160 2bid  Chief Complaint  Patient presents with  . Pulmonary Consult    Pt seen last in April 2013. He c/o SOB and fatigue for the past several months. He states that he gets SOB with any exertion. He is using proair approx 5 x per day. He has some cough in the am- prod at times with clear sputum. He states when he wakes up in the am's he feels tingling all over and wonders if he need so2 with sleep.   gradually worse since hot weather early July 2016 with doe x anything more than slow walk short distance = MMRC3 = can't walk 100 yards even at a slow pace at  a flat grade s stopping due to sob   Some better with saba, min assoc cough esp in ams when tends to use a lot of saba to get his day going  rec Add bystolic 5 mg daily  Please remember to go to the lab and x-ray department downstairs for your tests - we will call you with the results when they are available. Stop spiriva and start tudorza one twice daily after symbicort  And if happy fill the prescription, if not resume the spiriva and let us call you in the respimat form of spiriva to purchase before your next visit       10/02/2015  f/u ov/Aydan Phoenix re: copd III better on symb/ spiriva respimat and on bystolic for bp  Chief Complaint  Patient presents with  . Follow-up    Pt states his breathing has improved back to baseline. He has not used albuterol recently.     Improving s need for so much albterol and now on symb/spiriva spray but still urge to clear throat, intermittent hoarseness = doe now [facility] = can walk nl pace, flat grade, can't hurry or go uphills or steps s sob      No obvious day to day or daytime variability or assoc chronic  cough or cp or chest tightness, subjective wheeze or overt sinus or hb symptoms. No unusual exp hx or h/o childhood pna/ asthma or knowledge of premature birth.  Sleeping ok without nocturnal   exacerbation  of respiratory  c/o's or need for noct saba. Also denies any obvious fluctuation of symptoms with weather or environmental changes or other aggravating or alleviating factors except as outlined above   Current Medications, Allergies, Complete Past Medical History, Past Surgical History, Family History, and Social History [facility].  ROS  The following are not active complaints unless bolded sore throat, dysphagia, dental problems, itching, sneezing,  nasal congestion or excess/ purulent secretions, ear ache,   fever, chills, sweats, unintended wt loss, classically pleuritic or exertional cp, hemoptysis,   orthopnea pnd or leg swelling, presyncope, palpitations, abdominal pain, anorexia, nausea, vomiting, diarrhea  or change in bowel or bladder habits, change in stools or urine, dysuria,hematuria,  rash, arthralgias, visual complaints, headache, numbness, weakness or ataxia or problems with walking or coordination,  change in mood/affect or memory.             Past Medical History:  EMPHYSEMA (ICD-492.8)  CHRONIC OBSTRUCTIVE PULMONARY DISEASE, MODERATE (ICD-496)  GERD  Coronary artery disease - minor by cath 2000  Allergic rhinitis  Multiple rib fxs 3/20011          Objective:   Physical Exam   Amb wm nad minimally hoarse - edentulous  wt 194 April 16, 2009 > 188 June 29, 2009 >   183 08/26/2011 > 10/07/2011  183 > 04/11/2012  180 > 178 09/04/2015 > 10/02/2015  182   HEENT mild turbinate edema. Oropharynx no thrush or excess pnd or cobblestoning. No JVD or cervical adenopathy. Mild accessory muscle hypertrophy. Trachea midline, nl thryroid. Chest was hyperinflated by percussion with diminished breath sounds and moderate increased exp time without wheeze. Hoover sign positive at mid inspiration. Regular rate and rhythm without murmur gallop or rub or increase P2 or edema. Abd: no hsm, nl excursion. Ext warm without cyanosis or clubbing.    CXR PA and Lateral:   09/04/2015 :     I personally reviewed images and agree with radiology impression as follows:    No active cardiopulmonary disease.    Labs   reviewed:   Lab 09/04/15 1126  NA 139  K 4.6  CL 102  CO2 30  BUN 11  CREATININE 0.81  GLUCOSE 90    Lab 09/04/15 1126  HGB 15.2  HCT 45.2  WBC 8.5  PLT 228.0     Lab Results  Component Value Date   TSH 1.90 09/04/2015     Lab Results  Component Value Date   PROBNP 16.0 09/04/2015                   Assessment:

## 2015-10-02 NOTE — Patient Instructions (Signed)
When you run out of the bystolic samples > bisoprolol 5mg  daily   Try prilosec otc 20mg   Take 30-60 min before first meal of the day and Pepcid ac (famotidine) 20 mg one @  bedtime until return    GERD (REFLUX)  is an extremely common cause of respiratory symptoms just like yours , many times with no obvious heartburn at all.    It can be treated with medication, but also with lifestyle changes including elevation of the head of your bed (ideally with 6 inch  bed blocks),  Smoking cessation, avoidance of late meals, excessive alcohol, and avoid fatty foods, chocolate, peppermint, colas, red wine, and acidic juices such as orange juice.  NO MINT OR MENTHOL PRODUCTS SO NO COUGH DROPS  USE SUGARLESS CANDY INSTEAD (Jolley ranchers or Stover's or Life Savers) or even ice chips will also do - the key is to swallow to prevent all throat clearing. NO OIL BASED VITAMINS - use powdered substitutes.  Please schedule a follow up visit in 3 months but call sooner if needed > bring all meds with you

## 2015-10-03 NOTE — Assessment & Plan Note (Signed)
Quit smoking 1992 - PFT's 03/24/05   FEV1 1.37 ratio 42, 21% improvment in FVC after B2, DLC0 80  - PFT's 06/29/09 FEV1 1.19 ratio 29,   6 % better after B2, DLC0 73  - PFT's 10/07/2011  1.33 (43%) ratio 30 no better DLCO 82% so GOLD III  - Alpha one genotype  10/07/2011 >    MM   - 10/02/2015  extensive coaching HFA effectiveness =   90%   Much better on present rx except for the hoarseness > try gerd rx but no change pulmonary meds for now  I had an extended discussion with the patient reviewing all relevant studies completed to date and  lasting 15 to 20 minutes of a 25 minute visit    Each maintenance medication was reviewed in detail including most importantly the difference between maintenance and prns and under what circumstances the prns are to be triggered using an action plan format that is not reflected in the computer generated alphabetically organized AVS.    Please see instructions for details which were reviewed in writing and the patient given a copy highlighting the part that I personally wrote and discussed at today's ov.

## 2015-10-03 NOTE — Assessment & Plan Note (Signed)
Adequate control on present rx, reviewed > no change in rx needed  But can't afford bystolic so try generic equivalent = bisoprolol 5 mg daily

## 2015-12-07 ENCOUNTER — Other Ambulatory Visit: Payer: Self-pay | Admitting: Internal Medicine

## 2015-12-22 ENCOUNTER — Telehealth: Payer: Self-pay

## 2015-12-22 NOTE — Telephone Encounter (Signed)
Call to intro AWV; STated he had apt on 1/16 and would prefer to come in after his apt in pulmonary. Scheduled for 11am on 1/16; can reschedule as needed.

## 2016-01-04 ENCOUNTER — Ambulatory Visit (INDEPENDENT_AMBULATORY_CARE_PROVIDER_SITE_OTHER): Payer: Medicare Other | Admitting: Internal Medicine

## 2016-01-04 ENCOUNTER — Ambulatory Visit (INDEPENDENT_AMBULATORY_CARE_PROVIDER_SITE_OTHER): Payer: Medicare Other

## 2016-01-04 ENCOUNTER — Encounter: Payer: Self-pay | Admitting: Internal Medicine

## 2016-01-04 VITALS — BP 158/84 | HR 76 | Ht 70.0 in | Wt 183.0 lb

## 2016-01-04 VITALS — BP 130/80 | Ht 70.0 in | Wt 184.0 lb

## 2016-01-04 DIAGNOSIS — Z1159 Encounter for screening for other viral diseases: Secondary | ICD-10-CM | POA: Diagnosis not present

## 2016-01-04 DIAGNOSIS — J449 Chronic obstructive pulmonary disease, unspecified: Secondary | ICD-10-CM

## 2016-01-04 DIAGNOSIS — Z0000 Encounter for general adult medical examination without abnormal findings: Secondary | ICD-10-CM

## 2016-01-04 DIAGNOSIS — I10 Essential (primary) hypertension: Secondary | ICD-10-CM

## 2016-01-04 NOTE — Progress Notes (Signed)
Subjective:     Patient ID: Kirk Anderson, male   DOB: February 15, 1946   MRN: [id number]     Brief patient profile:  69yowm quit smoking 1992 with COPD FEV1 42% 03/24/05 c/w GOLD III criteria    History of Present Illness  June 29, 2009 ov with pft's no improvment doe and very hoarse, lots of throat clearing, no excess mucus imp was upper airway instability ? advair irriating  rec Stop advair  Start Symbicort 160 2 puffs first thing in am and 2 puffs again in pm about 12 hours later      08/26/2011 f/u ov/Shannon Balthazar cc doe x large aisle like [employer],  freq use of combivent when humid. rec Start spiriva one capsule each am You should see gradual improvement in your breathing and much less need for combivent     10/07/2011 f/u ov/Quierra Silverio cc breathing much better, no longer needing combivent at all attributed to cooler weather but only happened p rx with spiriva added to symbicort.  rec Try just use symbicort 160 2 puffs every 12 hours if needed    04/11/2012 f/u ov/Kolbee Bogusz cc doe about the same, only with exertion,  on maint spiriva and sometimes symbicort, minimal mostly dry cough. No daytime saba need at all, no variabilty in symptoms but hasn't gotten hot yet so too soon to tell for sure. No real limiting sob with desired activities. rec F/u prn    09/04/2015  New pt/ re-establish care  GOLD III copd cc worse breathing on spiriva dpi and symb 160 2bid  Chief Complaint  Patient presents with  . Pulmonary Consult    Pt seen last in April 2013. He c/o SOB and fatigue for the past several months. He states that he gets SOB with any exertion. He is using proair approx 5 x per day. He has some cough in the am- prod at times with clear sputum. He states when he wakes up in the am's he feels tingling all over and wonders if he need so2 with sleep.   gradually worse since hot weather early July 2016 with doe x anything more than slow walk short distance = MMRC3 = can't walk 100 yards even at a slow pace at  a flat grade s stopping due to sob   Some better with saba, min assoc cough esp in ams when tends to use a lot of saba to get his day going  rec Add bystolic 5 mg daily  Please remember to go to the lab and x-ray department downstairs for your tests - we will call you with the results when they are available. Stop spiriva and start tudorza one twice daily after symbicort  And if happy fill the prescription, if not resume the spiriva and let us call you in the respimat form of spiriva to purchase before your next visit       10/02/2015  f/u ov/Nowell Sites re: copd III better on symb/ spiriva respimat and on bystolic for bp  Chief Complaint  Patient presents with  . Follow-up    Pt states his breathing has improved back to baseline. He has not used albuterol recently.    Improving s need for so much albterol and now on symb/spiriva spray but still urge to clear throat, intermittent hoarseness = doe now [facility] = can walk nl pace, flat grade, can't hurry or go uphills or steps s sob   rec When you run out of the bystolic samples > bisoprolol  daily  Try  prilosec otc 20mg   Take 30-60 min before first meal of the day and Pepcid ac (famotidine) 20 mg one @  bedtime until return GERD diet   Please schedule a follow up visit in 3 months but call sooner if needed > bring all meds with you     01/04/2016  f/u ov/Najee Cowens re: GOLD III copd / miant on spiriva respimat and symbicort 160 2bid >no meds  Chief Complaint  Patient presents with  . Follow-up    Pt states that his breathing is still doing well. He has still not had to use proair. No new co's today.   main issue is hoarseness/ chronic/ worse in am's    No obvious day to day or daytime variability or assoc chronic cough or cp or chest tightness, subjective wheeze or overt sinus or hb symptoms. No unusual exp hx or h/o childhood pna/ asthma or knowledge of premature birth.  Sleeping ok without nocturnal   exacerbation  of respiratory  c/o's or need  for noct saba. Also denies any obvious fluctuation of symptoms with weather or environmental changes or other aggravating or alleviating factors except as outlined above   Current Medications, Allergies, Complete Past Medical History, Past Surgical History, Family History, and Social History were reviewed in [employer] record.  ROS  The following are not active complaints unless bolded sore throat, dysphagia, dental problems, itching, sneezing,  nasal congestion or excess/ purulent secretions, ear ache,   fever, chills, sweats, unintended wt loss, classically pleuritic or exertional cp, hemoptysis,  orthopnea pnd or leg swelling, presyncope, palpitations, abdominal pain, anorexia, nausea, vomiting, diarrhea  or change in bowel or bladder habits, change in stools or urine, dysuria,hematuria,  rash, arthralgias, visual complaints, headache, numbness, weakness or ataxia or problems with walking or coordination,  change in mood/affect or memory.             Past Medical History:  EMPHYSEMA (ICD-492.8)  CHRONIC OBSTRUCTIVE PULMONARY DISEASE, MODERATE (ICD-496)  GERD  Coronary artery disease - minor by cath 2000  Allergic rhinitis  Multiple rib fxs 3/20011          Objective:   Physical Exam   Amb wm nad minimally hoarse - edentulous/ vital signs reviewed - HBP noted  wt 194 April 16, 2009 > 188 June 29, 2009 >   183 08/26/2011 > 10/07/2011  183 > 04/11/2012  180 > 178 09/04/2015 > 10/02/2015  182 >  01/04/2016 183   HEENT mild turbinate edema. Oropharynx no thrush or excess pnd or cobblestoning. No JVD or cervical adenopathy. Mild accessory muscle hypertrophy. Trachea midline, nl thryroid. Chest was hyperinflated by percussion with diminished breath sounds and moderate increased exp time without wheeze. Hoover sign positive at mid inspiration. Regular rate and rhythm without murmur gallop or rub or increase P2 or edema. Abd: no hsm, nl excursion. Ext warm without cyanosis  or clubbing.    CXR PA and Lateral:   09/04/2015 :     I personally reviewed images and agree with radiology impression as follows:    No active cardiopulmonary disease.    Labs   reviewed:   Lab 09/04/15 1126  NA 139  K 4.6  CL 102  CO2 30  BUN 11  CREATININE 0.81  GLUCOSE 90    Lab 09/04/15 1126  HGB 15.2  HCT 45.2  WBC 8.5  PLT 228.0     Lab Results  Component Value Date   TSH 1.90 09/04/2015  Lab Results  Component Value Date   PROBNP 16.0 09/04/2015                   Assessment:

## 2016-01-04 NOTE — Patient Instructions (Addendum)
Increase the pepcid to 20 mg at bedtime for now to see what difference if any this makes in your voice/ hoarseness and Dr Jonny RuizJohn can do the follow up as to whether you need to see a throat specialist    If you are satisfied with your treatment plan,  let your doctor know and he/she can either refill your medications or you can return here when your prescription runs out.     If in any way you are not 100% satisfied,  please tell us.  If 100% better, tell your friends!  Pulmonary follow up is as needed

## 2016-01-04 NOTE — Progress Notes (Addendum)
Subjective:   Kirk Anderson is a 70 y.o. male who presents for Medicare Annual/Subsequent preventive examination.  Review of Systems:   HRA assessment completed during visit;  The Patient was informed that this wellness visit is to identify risk and educate on how to reduce risk for increase disease through lifestyle changes.   ROS deferred to CPE exam with physician  Medical issues  Dr. Sherene SiresWert; for COPD; Gold III ? pul rehab Stop spiriva and states he is taking Symbicort and Spriva respimat. 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 States breathing is much better since starting the Spiriva respimate; States mailbox is .2 mile up and down hill and does get sob when walking back to the home. No desire to increase focused exercise at this time.   SAFETY; multilevel home; will probably age in place/ 2 baths upstairs and one shower downstairs that is a walk in. [place] and [place] here for medical  care per the suggestion of his dtr.   Safety reviewed for the home; including removal of clutter; clear paths through the home, bathroom safety; has hardwood floors; community safety; lives in woods;  No falls; no issue with gait    Smoke detectors in place and firearms safety reviewed Driving accidents No and seatbelt no Sun protection; wears your hat  Stressors; 1-5; 1 as he does not feel stressed;   Medication review/ Spiriva respimat helps him; stopped spirva;   Fall assessment no Gait assessment; no issue   Mobilization and Functional losses in the last year. No losses Call walk .2 mile to [employer];  Stays busy; monitors ruatan club; Assist with building upkeep Sleep patterns/ not as good as he would like too Sometimes nap during the day   Urinary or fecal incontinence reviewed/ no   Counseling: Hep C ordered today and will draw at next blood draw  Colonoscopy; 03/2013/ repeat in 10 years; 03/2023;  EKG: 03/2015 Hearing: left ear; 2000  and 4000 in right Ophthalmology exam; almost a year ago; have a scared retina on left    Immunizations Due; Had the shingles, but thinks he had the vaccine and is not sure; Discussed Part D benefit. Dentist; all dentures; making new dentures currently   Current Care Team reviewed and updated Dr. Sherene SiresWert   Cardiac Risk Factors include: advanced age (>4055men, 5>65 women);hypertension;male gender  Objective:    Vitals: BP 130/80 mmHg  Ht 5\' 10"  (1.778 m)  Wt 184 lb (83.462 kg)  BMI 26.40 kg/m2  Tobacco History  Smoking status  . Former Smoker -- 2.00 packs/day for 30 years  . Types: Cigarettes  . Quit date: 12/19/1990  Smokeless tobacco  . Current User  . Types: Chew     Ready to quit: Yes Counseling given: Not Answered   Past Medical History  Diagnosis Date  . EMPHYSEMA 10/29/2007  . CORONARY ARTERY DISEASE 05/26/2008    minor by cath 2000  . ALLERGIC RHINITIS 05/26/2008  . GERD 05/26/2008  . COPD (chronic obstructive  pulmonary disease) (HCC)     PFT's 03/24/05 FEV1 1.37 ratio 42, 21% improvement in FVC after B2, DLCO 80 -PFT's 06/29/09 1.19 ratio 29 6% better after B2, DLC0 73 -HFA 75% 06/29/2009 >90% 08/10/2009  . Multiple rib fractures 02/2000  . Hyperlipidemia   . Impaired glucose tolerance 04/08/2014   Past Surgical History  Procedure Laterality Date  . Appendectomy    . Hemorrhoid surgery     Family History  Problem Relation Age of Onset  . Aneurysm Mother     Vacular Aneurysm  . Heart disease Father     CAD/Father had MI in his 31's  . Alcohol abuse Brother   . Colon cancer Neg Hx    History  Sexual Activity  . Sexual Activity: Not on file    Outpatient Encounter Prescriptions as of 01/04/2016  Medication Sig  . aspirin 81 MG tablet Take 81 mg by mouth daily.    . bisoprolol (ZEBETA) 5 MG tablet TAKE 1 TABLET (5 MG TOTAL) BY MOUTH DAILY.  Marland Kitchen dextromethorphan-guaiFENesin (MUCINEX DM) 30-600 MG per 12 hr tablet Take 1 tablet by mouth 2 (two) times daily as needed for cough.  . famotidine (PEPCID) 10 MG tablet Take 10 mg by mouth at bedtime.  . Multiple Vitamin (MULTIVITAMIN) tablet Take 1 tablet by mouth daily.    . naproxen sodium (ANAPROX) 220 MG tablet Take 440 mg by mouth 2 (two) times daily as needed (for pain).   Marland Kitchen omeprazole (PRILOSEC) 20 MG capsule Take 20 mg by mouth daily before breakfast.  . PROAIR HFA 108 (90 BASE) MCG/ACT inhaler INHALE 2 PUFFS INTO THE LUNGS EVERY 6 (SIX) HOURS AS NEEDED FOR WHEEZING OR SHORTNESS OF BREATH.  . SYMBICORT 160-4.5 MCG/ACT inhaler INHALE 2 PUFFS INTO THE LUNGS 2 (TWO) TIMES DAILY.  Marland Kitchen Tiotropium Bromide Monohydrate (SPIRIVA RESPIMAT) 2.5 MCG/ACT AERS Inhale 2 puffs into the lungs daily.   No facility-administered encounter medications on file as of 01/04/2016.    Activities of Daily Living In your present state of health, do you have any difficulty performing the following activities: 01/04/2016  Hearing? N  Vision? (No Data)  Difficulty  concentrating or making decisions? N  Walking or climbing stairs? (No Data)  Dressing or bathing? N  Doing errands, shopping? N  Preparing Food and eating ? N  Using the Toilet? N  In the past six months, have you accidently leaked urine? N  Do you have problems with loss of bowel control? N  Managing your Medications? N  Managing your Finances? N  Housekeeping or managing your Housekeeping? N    Patient Care Team: Corwin Levins, MD as PCP - General   Assessment:    Assessment   Patient presents for yearly preventative medicine examination. Medicare questionnaire screening were completed, i.e. Functional; fall risk; depression, memory loss and hearing were  all unremarkable.  All immunizations and health maintenance protocols were reviewed with the patient; not sure if he has had the zostavax.   Education provided for laboratory screens;    Medication reconciliation, past medical history, social history, problem list and allergies were reviewed in detail with the patient; the patient states that he is breathing much better spriva respirmat  Goals were established with regard to maintaining health  End of life planning was discussed and has completed a living will.   Exercise Activities and Dietary recommendations Current Exercise Habits:: Home exercise routine (stays busy outside)  Goals    . patient     Continue to active and fup with pulmonary      Fall Risk Fall Risk  01/04/2016 03/31/2015 04/08/2014  Falls in the past year? No No No   Depression Screen PHQ 2/9 Scores 01/04/2016 03/31/2015 04/08/2014  PHQ - 2 Score 0 0 0    Cognitive Testing No flowsheet data found.  Ad8 score 0   Immunization History  Administered Date(s) Administered  . Pneumococcal Conjugate-13 01/02/2014  . Pneumococcal Polysaccharide-23 02/12/2013  . Td 12/19/1993  . Tdap 02/12/2013   Screening Tests Health Maintenance  Topic Date Due  . Hepatitis C Screening  1946-08-14  . ZOSTAVAX   02/22/2006  . INFLUENZA VACCINE  03/18/2016 (Originally 07/20/2015)  . TETANUS/TDAP  02/12/2023  . COLONOSCOPY  04/12/2023  . PNA vac Low Risk Adult  Completed      Plan:     Plans to have vision check soon; Will check on shingles vaccine  During the course of the visit the patient was educated and counseled about the following appropriate screening and preventive services:   Vaccines to include Pneumoccal, Influenza, Hepatitis B, Td, Zostavax, HCV  Electrocardiogram/ 03/2015  Cardiovascular Disease/ BP in good control; Weight good  Colorectal cancer screening/ due 4/ 2024  Diabetes screening/ neg  Prostate Cancer Screening/ deferred  Glaucoma screening/ to have eyes checked soon  Nutrition counseling / BMI normal  Smoking cessation counseling/ quit smoking but now chewing tobacco; No desire to quit at this time.  Patient Instructions (the written plan) was given to the patient.    Montine Circle, RN  01/04/2016  Medical screening examination/treatment/procedure(s) were performed by non-physician practitioner and as supervising physician I was immediately available for consultation/collaboration. I agree with above. Oliver Barre, MD

## 2016-01-04 NOTE — Patient Instructions (Addendum)
Kirk Anderson , Thank you for taking time to come for your Medicare Wellness Visit. I appreciate your ongoing commitment to your health goals. Please review the following plan we discussed and let me know if I can assist you in the future.   Will have eye exam  Will go fishing again   These are the goals we discussed: Goals    . patient     Continue to active and fup with pulmonary       This is a list of the screening recommended for you and due dates:  Health Maintenance  Topic Date Due  .  Hepatitis C: One time screening is recommended by Center for Disease Control  (CDC) for  adults born from [birth year] through 1965.   [birth year]  . Shingles Vaccine  02/22/2006  . Flu Shot  03/18/2016*  . Tetanus Vaccine  02/12/2023  . Colon Cancer Screening  04/12/2023  . Pneumonia vaccines  Completed  *Topic was postponed. The date shown is not the original due date.      Health Maintenance, Male A healthy lifestyle and preventative care can promote health and wellness.  Maintain regular health, dental, and eye exams.  Eat a healthy diet. Foods like vegetables, fruits, whole grains, low-fat dairy products, and lean protein foods contain the nutrients you need and are low in calories. Decrease your intake of foods high in solid fats, added sugars, and salt. Get information about a proper diet from your health care provider, if necessary.  Regular physical exercise is one of the most important things you can do for your health. Most adults should get at least 150 minutes of moderate-intensity exercise (any activity that increases your heart rate and causes you to sweat) each week. In addition, most adults need muscle-strengthening exercises on 2 or more days a week.   Maintain a healthy weight. The body mass index (BMI) is a screening tool to identify possible weight problems. It provides an estimate of body fat based on height and weight. Your health care provider can find your BMI and can help  you achieve or maintain a healthy weight. For males 20 years and older:  A BMI below 18.5 is considered underweight.  A BMI of 18.5 to 24.9 is normal.  A BMI of 25 to 29.9 is considered overweight.  A BMI of 30 and above is considered obese.  Maintain normal blood lipids and cholesterol by exercising and minimizing your intake of saturated fat. Eat a balanced diet with plenty of fruits and vegetables. Blood tests for lipids and cholesterol should begin at age 54 and be repeated every 5 years. If your lipid or cholesterol levels are high, you are over age 35, or you are at high risk for heart disease, you may need your cholesterol levels checked more frequently.Ongoing high lipid and cholesterol levels should be treated with medicines if diet and exercise are not working.  If you smoke, find out from your health care provider how to quit. If you do not use tobacco, do not start.  Lung cancer screening is recommended for adults aged 55-80 years who are at high risk for developing lung cancer because of a history of smoking. A yearly low-dose CT scan of the lungs is recommended for people who have at least a 30-pack-year history of smoking and are current smokers or have quit within the past 15 years. A pack year of smoking is smoking an average of 1 pack of cigarettes a day for 1 year (  for example, a 30-pack-year history of smoking could mean smoking 1 pack a day for 30 years or 2 packs a day for 15 years). Yearly screening should continue until the smoker has stopped smoking for at least 15 years. Yearly screening should be stopped for people who develop a health problem that would prevent them from having lung cancer treatment.  If you choose to drink alcohol, do not have more than 2 drinks per day. One drink is considered to be 12 oz (360 mL) of beer, 5 oz (150 mL) of wine, or 1.5 oz (45 mL) of liquor.  Avoid the use of street drugs. Do not share needles with anyone. Ask for help if you need  support or instructions about stopping the use of drugs.  High blood pressure causes heart disease and increases the risk of stroke. High blood pressure is more likely to develop in:  People who have blood pressure in the end of the normal range (100-139/85-89 mm Hg).  People who are overweight or obese.  People who are African American.  If you are 2418-70 years of age, have your blood pressure checked every 3-5 years. If you are 70 years of age or older, have your blood pressure checked every year. You should have your blood pressure measured twice--once when you are at a hospital or clinic, and once when you are not at a hospital or clinic. Record the average of the two measurements. To check your blood pressure when you are not at a hospital or clinic, you can use:  An automated blood pressure machine at a pharmacy.  A home blood pressure monitor.  If you are 7745-708 years old, ask your health care provider if you should take aspirin to prevent heart disease.  Diabetes screening involves taking a blood sample to check your fasting blood sugar level. This should be done once every 3 years after age 70 if you are at a normal weight and without risk factors for diabetes. Testing should be considered at a younger age or be carried out more frequently if you are overweight and have at least 1 risk factor for diabetes.  Colorectal cancer can be detected and often prevented. Most routine colorectal cancer screening begins at the age of 70 and continues through age 70. However, your health care provider may recommend screening at an earlier age if you have risk factors for colon cancer. On a yearly basis, your health care provider may provide home test kits to check for hidden blood in the stool. A small camera at the end of a tube may be used to directly examine the colon (sigmoidoscopy or colonoscopy) to detect the earliest forms of colorectal cancer. Talk to your health care provider about this at age  70 when routine screening begins. A direct exam of the colon should be repeated every 5-10 years through age 70, unless early forms of precancerous polyps or small growths are found.  People who are at an increased risk for hepatitis B should be screened for this virus. You are considered at high risk for hepatitis B if:  You were born in a country where hepatitis B occurs often. Talk with your health care provider about which countries are considered high risk.  Your parents were born in a high-risk country and you have not received a shot to protect against hepatitis B (hepatitis B vaccine).  You have HIV or AIDS.  You use needles to inject street drugs.  You live with, or have sex  with, someone who has hepatitis B.  You are a man who has sex with other men (MSM).  You get hemodialysis treatment.  You take certain medicines for conditions like cancer, organ transplantation, and autoimmune conditions.  Hepatitis C blood testing is recommended for all people born from [birth year] through 1965 and any individual with known risk factors for hepatitis C.  Healthy men should no longer receive prostate-specific antigen (PSA) blood tests as part of routine cancer screening. Talk to your health care provider about prostate cancer screening.  Testicular cancer screening is not recommended for adolescents or adult males who have no symptoms. Screening includes self-exam, a health care provider exam, and other screening tests. Consult with your health care provider about any symptoms you have or any concerns you have about testicular cancer.  Practice safe sex. Use condoms and avoid high-risk sexual practices to reduce the spread of sexually transmitted infections (STIs).  You should be screened for STIs, including gonorrhea and chlamydia if:  You are sexually active and are younger than 24 years.  You are older than 24 years, and your health care provider tells you that you are at risk for this type  of infection.  Your sexual activity has changed since you were last screened, and you are at an increased risk for chlamydia or gonorrhea. Ask your health care provider if you are at risk.  If you are at risk of being infected with HIV, it is recommended that you take a prescription medicine daily to prevent HIV infection. This is called pre-exposure prophylaxis (PrEP). You are considered at risk if:  You are a man who has sex with other men (MSM).  You are a heterosexual man who is sexually active with multiple partners.  You take drugs by injection.  You are sexually active with a partner who has HIV.  Talk with your health care provider about whether you are at high risk of being infected with HIV. If you choose to begin PrEP, you should first be tested for HIV. You should then be tested every 3 months for as long as you are taking PrEP.  Use sunscreen. Apply sunscreen liberally and repeatedly throughout the day. You should seek shade when your shadow is shorter than you. Protect yourself by wearing long sleeves, pants, a wide-brimmed hat, and sunglasses year round whenever you are outdoors.  Tell your health care provider of new moles or changes in moles, especially if there is a change in shape or color. Also, tell your health care provider if a mole is larger than the size of a pencil eraser.  A one-time screening for abdominal aortic aneurysm (AAA) and surgical repair of large AAAs by ultrasound is recommended for men aged 65-75 years who are current or former smokers.  Stay current with your vaccines (immunizations).   This information is not intended to replace advice given to you by your health care provider. Make sure you discuss any questions you have with your health care provider.   Document Released: 06/02/2008 Document Revised: 12/26/2014 Document Reviewed: 05/02/2011 Elsevier Interactive Patient Education [employer]

## 2016-01-10 ENCOUNTER — Encounter: Payer: Self-pay | Admitting: Internal Medicine

## 2016-01-10 NOTE — Assessment & Plan Note (Addendum)
Quit smoking 1992 - PFT's 03/24/05   FEV1 1.37 ratio 42, 21% improvment in FVC after B2, DLC0 80  - PFT's 06/29/09 FEV1 1.19 ratio 29,   6 % better after B2, DLC0 73  - PFT's 10/07/2011  1.33 (43%) ratio 30 no better DLCO 82% so GOLD III  - Alpha one genotype  10/07/2011 >    MM - 10/02/2015  extensive coaching HFA effectiveness =   90%  Adequate control on present rx, reviewed > no change in rx needed    I had an extended final summary discussion with the patient reviewing all relevant studies completed to date and  lasting 15 to 20 minutes of a 25 minute visit on the following issues:    Am hoarseness may be due to noct gerd > try pepcid ac 20 mg hs > ent eval prn per Dr Jonny Ruiz  Formulary restrictions will be an ongoing challenge for the forseable future and I would be happy to pick an alternative if the pt will first  provide me a list of them but pt  will need to return here for training for any new device that is required eg dpi vs hfa vs respimat.    In meantime we can always provide samples so the patient never runs out of any needed respiratory medications.   Each maintenance medication was reviewed in detail including most importantly the difference between maintenance and as needed and under what circumstances the prns are to be used.  Please see instructions for details which were reviewed in writing and the patient given a copy.    Pulmonary f/u is prn

## 2016-01-10 NOTE — Assessment & Plan Note (Signed)
Not Adequate control on present rx, reviewed > no change in rx needed  For now > Follow up per Primary Care planned   

## 2016-02-17 ENCOUNTER — Encounter: Payer: Self-pay | Admitting: Family Medicine

## 2016-02-17 ENCOUNTER — Ambulatory Visit (INDEPENDENT_AMBULATORY_CARE_PROVIDER_SITE_OTHER): Payer: Medicare Other | Admitting: Family Medicine

## 2016-02-17 ENCOUNTER — Ambulatory Visit (HOSPITAL_BASED_OUTPATIENT_CLINIC_OR_DEPARTMENT_OTHER)
Admission: RE | Admit: 2016-02-17 | Discharge: 2016-02-17 | Disposition: A | Discharge status: Home / Self Care | Payer: Medicare Other | Source: Ambulatory Visit | Attending: Family Medicine | Admitting: Family Medicine

## 2016-02-17 VITALS — BP 128/78 | HR 85 | Temp 98.1°F | Ht 70.0 in | Wt 182.0 lb

## 2016-02-17 DIAGNOSIS — J441 Chronic obstructive pulmonary disease with (acute) exacerbation: Secondary | ICD-10-CM | POA: Diagnosis not present

## 2016-02-17 DIAGNOSIS — R05 Cough: Secondary | ICD-10-CM

## 2016-02-17 DIAGNOSIS — R059 Cough, unspecified: Secondary | ICD-10-CM

## 2016-02-17 MED ORDER — DOXYCYCLINE HYCLATE 100 MG PO CAPS: 100.0000 mg | ORAL_CAPSULE | Freq: Two times a day (BID) | ORAL | Status: DC

## 2016-02-17 MED ORDER — HYDROCODONE-HOMATROPINE 5-1.5 MG/5ML PO SYRP: 5.0000 mL | ORAL_SOLUTION | Freq: Three times a day (TID) | ORAL | Status: DC | PRN

## 2016-02-17 MED ORDER — PREDNISONE 20 MG PO TABS: ORAL_TABLET | ORAL | Status: DC

## 2016-02-17 NOTE — Patient Instructions (Signed)
Please go for your chest x-ray on the way home today I will call you if there are any concerning findings! For your current illness: Continue your inhalers, albuterol (proair) as needed Prednisone 40 mg daily for 5 days Doxycycline (antibiotic) twice daily for 10 days Hycodan cough syrup as needed- remember this will make you sleep! Let me know if you are not feeling better in the next 2-3 days- Sooner if worse.

## 2016-02-17 NOTE — Progress Notes (Signed)
[facility] [address] [place], [place] [address] [phone]  Date:  02/17/2016   Name:  Kirk Anderson   DOB:  06-27-1946   MRN:  [id number]  PCP:  Oliver Barre, MD    Chief Complaint: Acute Visit   History of Present Illness:  Kirk Anderson is a 70 y.o. very pleasant male patient who presents with the following:  Here today with illness- about 10 days ago he noted onset of mild illness sx. It first seemed like a cold, then he developed a productive cough He did have chills one night last week.   Yesterday am he felt good, then by later in the day he developed a horse voice.  This am he noted wheezing and was feeling worse again so he decided to come in   He has PND and cough that keeps him awake at night No GI symptoms.   No sick contacts  History of COPD- symbicort, spiriva.  He is still using these,  He used some proair this a that also helped   He does not have DM His pulmonologist is Dr. Sherene Sires He did quit smoking man yyears ago although he does chew tobacco  Patient Active Problem List   Diagnosis Date Noted  . Dyspnea 09/04/2015  . Essential hypertension 09/04/2015  . Weakness, facial 03/31/2015  . Acute sinus infection 08/27/2014  . Diverticulosis of colon without hemorrhage 04/08/2014  . Impaired glucose tolerance 04/08/2014  . CAP (community acquired pneumonia) 12/25/2013  . Preventative health care 02/12/2013  . Headache(784.0) 03/18/2011  . CORONARY ARTERY DISEASE 05/26/2008  . ALLERGIC RHINITIS 05/26/2008  . GERD 05/26/2008  . Facial weakness 05/26/2008  . COPD GOLD III 10/29/2007    Past Medical History  Diagnosis Date  . EMPHYSEMA 10/29/2007  . CORONARY ARTERY DISEASE 05/26/2008    minor by cath 2000  . ALLERGIC RHINITIS 05/26/2008  . GERD 05/26/2008  . COPD (chronic obstructive pulmonary disease) (HCC)     PFT's 03/24/05 FEV1 1.37 ratio 42, 21% improvement in FVC after B2, DLCO 80  -PFT's 06/29/09 1.19 ratio 29 6% better after B2, DLC0 73 -HFA 75% 06/29/2009 >90% 08/10/2009  . Multiple rib fractures 02/2000  . Hyperlipidemia   . Impaired glucose tolerance 04/08/2014    Past Surgical History  Procedure Laterality Date  . Appendectomy    . Hemorrhoid surgery      Social History  Substance Use Topics  . Smoking status: Former Smoker -- 2.00 packs/day for 30 years    Types: Cigarettes    Quit date: 12/19/1990  . Smokeless tobacco: Current User    Types: Chew  . 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 Aneurysm Mother     Vacular Aneurysm  . Heart disease Father     CAD/Father had MI in his 97's  . Alcohol abuse Brother   . Colon cancer Neg Hx     Allergies  Allergen Reactions  . Statins Other (See Comments)    weakness  . Sudafed [Pseudoephedrine Hcl] Other (See Comments)    Urinary retention    Medication list has been reviewed and updated.  Current Outpatient Prescriptions on File Prior to Visit  Medication Sig Dispense Refill  . aspirin 81 MG tablet Take 81 mg by mouth daily.      . bisoprolol (ZEBETA)  5 MG tablet TAKE 1 TABLET (5 MG TOTAL) BY MOUTH DAILY. 30 tablet 2  . dextromethorphan-guaiFENesin (MUCINEX DM) 30-600 MG per 12 hr tablet Take 1 tablet by mouth 2 (two) times daily as needed for cough.    . famotidine (PEPCID) 10 MG tablet Take 10 mg by mouth at bedtime.    . Multiple Vitamin (MULTIVITAMIN) tablet Take 1 tablet by mouth daily.      . naproxen sodium (ANAPROX) 220 MG tablet Take 440 mg by mouth 2 (two) times daily as needed (for pain).     Marland Kitchen omeprazole (PRILOSEC) 20 MG capsule Take 20 mg by mouth daily before breakfast.    . PROAIR HFA 108 (90 BASE) MCG/ACT inhaler INHALE 2 PUFFS INTO THE LUNGS EVERY 6 (SIX) HOURS AS NEEDED FOR WHEEZING OR SHORTNESS OF BREATH. 8.5 g 11  . SYMBICORT 160-4.5 MCG/ACT inhaler INHALE 2 PUFFS INTO THE LUNGS 2 (TWO) TIMES  DAILY. 30.6 g 3  . Tiotropium Bromide Monohydrate (SPIRIVA RESPIMAT) 2.5 MCG/ACT AERS Inhale 2 puffs into the lungs daily. 1 Inhaler 6   No current facility-administered medications on file prior to visit.    Review of Systems:  As per HPI- otherwise negative.   Physical Examination: Filed Vitals:   02/17/16 1111  BP: 128/78  Pulse: 85   Filed Vitals:   02/17/16 1111  Height:  (1.778 m)  Weight: 182 lb (82.555 kg)   Body mass index is 26.11 kg/(m^2). Ideal Body Weight: Weight in (lb) to have BMI = 25: 173.9  GEN: WDWN, NAD, Non-toxic, A & O x 3, looks well but is congested and has some cough HEENT: Atraumatic, Normocephalic. Neck supple. No masses, No LAD.  Bilateral TM wnl, oropharynx normal.  PEERL,EOMI.   Ears and Nose: No external deformity. CV: RRR, No M/G/R. No JVD. No thrill. No extra heart sounds. PULM: mild wheezing and ronchi, No retractions. No resp. distress. No accessory muscle use. EXTR: No c/c/e NEURO Normal gait.  PSYCH: Normally interactive. Conversant. Not depressed or anxious appearing.  Calm demeanor.   Dg Chest 2 View  02/17/2016  CLINICAL DATA:  Productive cough congestion for 1 week, history of emphysema, former smoking history EXAM: CHEST  2 VIEW COMPARISON:  Chest x-ray of 09/04/2015 FINDINGS: No active infiltrate or effusion is seen. The lungs are hyperaerated with flattened hemidiaphragms suggests and element of emphysema. Mediastinal and hilar contours are unremarkable. The heart is within normal limits in size. No acute bony abnormality is seen. Old left lateral rib fractures are noted. IMPRESSION: Emphysema.  No definite active process. Electronically Signed   By: Dwyane Dee M.D.   On: 02/17/2016 12:10    Assessment and Plan: COPD exacerbation (HCC) - Plan: DG Chest 2 View, doxycycline (VIBRAMYCIN) 100 MG capsule, predniSONE (DELTASONE) 20 MG tablet  Cough - Plan: HYDROcodone-homatropine (HYCODAN) 5-1.5 MG/5ML syrup  Treat for COPD  exacerbation with doxycycline and prednisone Hycodan as needed He will let me know f not better soon  Signed Abbe Amsterdam, MD

## 2016-03-16 ENCOUNTER — Encounter: Payer: Self-pay | Admitting: Internal Medicine

## 2016-03-16 ENCOUNTER — Ambulatory Visit (INDEPENDENT_AMBULATORY_CARE_PROVIDER_SITE_OTHER): Payer: Medicare Other | Admitting: Internal Medicine

## 2016-03-16 VITALS — BP 104/70 | HR 80 | Ht 70.0 in | Wt 179.0 lb

## 2016-03-16 DIAGNOSIS — J449 Chronic obstructive pulmonary disease, unspecified: Secondary | ICD-10-CM | POA: Diagnosis not present

## 2016-03-16 MED ORDER — GLYCOPYRROLATE-FORMOTEROL 9-4.8 MCG/ACT IN AERO: 2.0000 | INHALATION_SPRAY | Freq: Two times a day (BID) | RESPIRATORY_TRACT | Status: DC

## 2016-03-16 NOTE — Patient Instructions (Addendum)
Plan A = Automatic = Bevespi Take 2 puffs first thing in am and then another 2 puffs about 12 hours later. (stop stop symbicort/spiriva)    Plan B = Backup Only use your albuterol as a rescue medication to be used if you can't catch your breath by resting or doing a relaxed purse lip breathing pattern.  - The less you use it, the better it will work when you need it. - Ok to use up to 2 puffs  every 4 hours if you must but call for appointment if use goes up over your usual need - Don't leave home without it !!  (think of it like the spare tire for your car)   Please schedule a follow up visit in 3 months but call sooner if needed  Add: will ask him to consider rehab if not better in 2 weeks

## 2016-03-16 NOTE — Progress Notes (Signed)
Subjective:     Patient ID: Kirk Anderson, male   DOB: February 15, 1946   MRN: [id number]     Brief patient profile:  69yowm quit smoking 1992 with COPD FEV1 42% 03/24/05 c/w GOLD III criteria    History of Present Illness  June 29, 2009 ov with pft's no improvment doe and very hoarse, lots of throat clearing, no excess mucus imp was upper airway instability ? advair irriating  rec Stop advair  Start Symbicort 160 2 puffs first thing in am and 2 puffs again in pm about 12 hours later      08/26/2011 f/u ov/Kirk Anderson cc doe x large aisle like [employer],  freq use of combivent when humid. rec Start spiriva one capsule each am You should see gradual improvement in your breathing and much less need for combivent     10/07/2011 f/u ov/Kirk Anderson cc breathing much better, no longer needing combivent at all attributed to cooler weather but only happened p rx with spiriva added to symbicort.  rec Try just use symbicort 160 2 puffs every 12 hours if needed    04/11/2012 f/u ov/Kirk Anderson cc doe about the same, only with exertion,  on maint spiriva and sometimes symbicort, minimal mostly dry cough. No daytime saba need at all, no variabilty in symptoms but hasn't gotten hot yet so too soon to tell for sure. No real limiting sob with desired activities. rec F/u prn    09/04/2015  New pt/ re-establish care  GOLD III copd cc worse breathing on spiriva dpi and symb 160 2bid  Chief Complaint  Patient presents with  . Pulmonary Consult    Pt seen last in April 2013. He c/o SOB and fatigue for the past several months. He states that he gets SOB with any exertion. He is using proair approx 5 x per day. He has some cough in the am- prod at times with clear sputum. He states when he wakes up in the am's he feels tingling all over and wonders if he need so2 with sleep.   gradually worse since hot weather early July 2016 with doe x anything more than slow walk short distance = MMRC3 = can't walk 100 yards even at a slow pace at  a flat grade s stopping due to sob   Some better with saba, min assoc cough esp in ams when tends to use a lot of saba to get his day going  rec Add bystolic 5 mg daily  Please remember to go to the lab and x-ray department downstairs for your tests - we will call you with the results when they are available. Stop spiriva and start tudorza one twice daily after symbicort  And if happy fill the prescription, if not resume the spiriva and let us call you in the respimat form of spiriva to purchase before your next visit       10/02/2015  f/u ov/Kirk Anderson re: copd III better on symb/ spiriva respimat and on bystolic for bp  Chief Complaint  Patient presents with  . Follow-up    Pt states his breathing has improved back to baseline. He has not used albuterol recently.    Improving s need for so much albterol and now on symb/spiriva spray but still urge to clear throat, intermittent hoarseness = doe now [facility] = can walk nl pace, flat grade, can't hurry or go uphills or steps s sob   rec When you run out of the bystolic samples > bisoprolol  daily  Try  prilosec otc   Take 30-60 min before first meal of the day and Pepcid ac (famotidine) 20 mg one @  bedtime until return GERD diet   Please schedule a follow up visit in 3 months but call sooner if needed > bring all meds with you     01/04/2016  f/u ov/Kirk Anderson re: GOLD III copd / maint on spiriva respimat and symbicort 160 2bid >no meds  Chief Complaint  Patient presents with  . Follow-up    Pt states that his breathing is still doing well. He has still not had to use proair. No new co's today.   main issue is hoarseness/ chronic/ worse in am's  rec Increase the pepcid to 20 mg at bedtime for now to see what difference if any this makes in your voice/ hoarseness and Dr Jonny Ruiz can do the follow up as to whether you need to see a throat specialist   03/16/2016  f/u ov/Kirk Anderson re: worse sob/ COPD GOLD III maint on symb/spirva Chief Complaint   Patient presents with  . Acute Visit    Pt c/o increased SOB. recently completed Pred and Doxy 02/17/16.  Pt feels breathing never returned to baseline.  no resp problems at hs/ ok at rest - wasn't using saba at all and now a couple of times a day, last about about 4 h prior to OV  X 6 weeks p rx for aecopd/uri  No obvious day to day or daytime variability or assoc excess/ purulent sputum or mucus plugs   or cp or chest tightness, subjective wheeze or overt sinus or hb symptoms. No unusual exp hx or h/o childhood pna/ asthma or knowledge of premature birth.  Sleeping ok without nocturnal   exacerbation  of respiratory  c/o's or need for noct saba. Also denies any obvious fluctuation of symptoms with weather or environmental changes or other aggravating or alleviating factors except as outlined above   Current Medications, Allergies, Complete Past Medical History, Past Surgical History, Family History, and Social History were reviewed in [employer] record.  ROS  The following are not active complaints unless bolded sore throat, dysphagia, dental problems, itching, sneezing,  nasal congestion or excess/ purulent secretions, ear ache,   fever, chills, sweats, unintended wt loss, classically pleuritic or exertional cp, hemoptysis,  orthopnea pnd or leg swelling, presyncope, palpitations, abdominal pain, anorexia, nausea, vomiting, diarrhea  or change in bowel or bladder habits, change in stools or urine, dysuria,hematuria,  rash, arthralgias, visual complaints, headache, numbness, weakness or ataxia or problems with walking or coordination,  change in mood/affect or memory.             Past Medical History:  EMPHYSEMA (ICD-492.8)  CHRONIC OBSTRUCTIVE PULMONARY DISEASE, MODERATE (ICD-496)  GERD  Coronary artery disease - minor by cath 2000  Allergic rhinitis  Multiple rib fxs 3/20011          Objective:   Physical Exam   Amb wm nad minimally hoarse - edentulous/  vital signs reviewed    wt 194 April 16, 2009 > 188 June 29, 2009 >   183 08/26/2011 > 10/07/2011  183 > 04/11/2012  180 > 178 09/04/2015 > 10/02/2015  182 >  01/04/2016 183 > 03/16/2016  180   HEENT mild turbinate edema. Oropharynx no thrush or excess pnd or cobblestoning. No JVD or cervical adenopathy. Mild accessory muscle hypertrophy. Trachea midline, nl thryroid. Chest was hyperinflated by percussion with diminished breath sounds and moderate increased exp time without  wheeze. Hoover sign positive at mid inspiration. Regular rate and rhythm without murmur gallop or rub or increase P2 or edema. Abd: no hsm, nl excursion. Ext warm without cyanosis or clubbing.     CXR PA and Lateral:   03/16/2016 :    I personally reviewed images and agree with radiology impression as follows:    Emphysema. No definite active process.            Assessment:

## 2016-03-17 NOTE — Assessment & Plan Note (Signed)
Quit smoking 1992 - PFT's 03/24/05   FEV1 1.37 ratio 42, 21% improvment in FVC after B2, DLC0 80  - PFT's 06/29/09 FEV1 1.19 ratio 29,   6 % better after B2, DLC0 73  - PFT's 10/07/2011  1.33 (43%) ratio 30 no better DLCO 82% so GOLD III  - Alpha one genotype  10/07/2011 >    MM   -03/16/2016  extensive coaching HFA effectiveness =       90% > trial of bevespi  - 03/16/2016   Walked RA  2 laps @ 185 ft each stopped due to sob with sats 88%   Clearly worse since last exac though no cough or noct symptoms > rec trial of bevespi 2 bid and see if improves and or less need for saba.  Strongly consider rehab referral.   I had an extended discussion with the patient reviewing all relevant studies completed to date and  lasting 15 to 20 minutes of a 25 minute visit    Each maintenance medication was reviewed in detail including most importantly the difference between maintenance and prns and under what circumstances the prns are to be triggered using an action plan format that is not reflected in the computer generated alphabetically organized AVS.    Please see instructions for details which were reviewed in writing and the patient given a copy highlighting the part that I personally wrote and discussed at today's ov.

## 2016-03-28 ENCOUNTER — Telehealth: Payer: Self-pay | Admitting: *Deleted

## 2016-03-28 NOTE — Telephone Encounter (Signed)
lmtcb

## 2016-03-28 NOTE — Telephone Encounter (Signed)
-----   Message from Nyoka CowdenMichael B Wert, MD sent at 03/17/2016  8:35 AM EDT ----- Call and ask him if he would consider rehab if not improving on bevespi by now

## 2016-03-28 NOTE — Telephone Encounter (Signed)
Spoke with the pt  He states has good and bad days  Wants to do pulmonary rehab, but [place] and wonders if there is somewhere close to him that he could go for rehab  [facility]  Thanks

## 2016-03-28 NOTE — Telephone Encounter (Signed)
i dont know of anywhere unless [facility] maybe but we will need a referral put in for it Tobe SosSally E Ottinger

## 2016-04-11 ENCOUNTER — Other Ambulatory Visit: Payer: Self-pay | Admitting: Internal Medicine

## 2016-04-19 ENCOUNTER — Other Ambulatory Visit: Payer: Self-pay | Admitting: Internal Medicine

## 2016-06-16 ENCOUNTER — Encounter: Payer: Self-pay | Admitting: Internal Medicine

## 2016-06-16 ENCOUNTER — Ambulatory Visit (INDEPENDENT_AMBULATORY_CARE_PROVIDER_SITE_OTHER): Payer: Medicare Other | Admitting: Internal Medicine

## 2016-06-16 VITALS — BP 110/80 | HR 94 | Ht 70.0 in | Wt 185.0 lb

## 2016-06-16 DIAGNOSIS — J449 Chronic obstructive pulmonary disease, unspecified: Secondary | ICD-10-CM | POA: Diagnosis not present

## 2016-06-16 NOTE — Patient Instructions (Addendum)
No change in medications = spiriva/ symbicort   Only use your albuterol as a rescue medication to be used if you can't catch your breath by resting or doing a relaxed purse lip breathing pattern.  - The less you use it, the better it will work when you need it. - Ok to use up to 2 puffs  every 4 hours if you must but call for immediate appointment if use goes up over your usual need - Don't leave home without it !!  (think of it like the spare tire for your car)    If you are satisfied with your treatment plan,  let your doctor know and he/she can either refill your medications or you can return here when your prescription runs out.     If in any way you are not 100% satisfied,  please tell us.  If 100% better, tell your friends!  Pulmonary follow up is as needed

## 2016-06-16 NOTE — Progress Notes (Signed)
Subjective:     Patient ID: Kirk Anderson, male   DOB: February 15, 1946   MRN: [id number]     Brief patient profile:  69yowm quit smoking 1992 with COPD FEV1 42% 03/24/05 c/w GOLD III criteria    History of Present Illness  June 29, 2009 ov with pft's no improvment doe and very hoarse, lots of throat clearing, no excess mucus imp was upper airway instability ? advair irriating  rec Stop advair  Start Symbicort 160 2 puffs first thing in am and 2 puffs again in pm about 12 hours later      08/26/2011 f/u ov/Kirk Anderson cc doe x large aisle like [employer],  freq use of combivent when humid. rec Start spiriva one capsule each am You should see gradual improvement in your breathing and much less need for combivent     10/07/2011 f/u ov/Kirk Anderson cc breathing much better, no longer needing combivent at all attributed to cooler weather but only happened p rx with spiriva added to symbicort.  rec Try just use symbicort 160 2 puffs every 12 hours if needed    04/11/2012 f/u ov/Kirk Anderson cc doe about the same, only with exertion,  on maint spiriva and sometimes symbicort, minimal mostly dry cough. No daytime saba need at all, no variabilty in symptoms but hasn't gotten hot yet so too soon to tell for sure. No real limiting sob with desired activities. rec F/u prn    09/04/2015  New pt/ re-establish care  GOLD III copd cc worse breathing on spiriva dpi and symb 160 2bid  Chief Complaint  Patient presents with  . Pulmonary Consult    Pt seen last in April 2013. He c/o SOB and fatigue for the past several months. He states that he gets SOB with any exertion. He is using proair approx 5 x per day. He has some cough in the am- prod at times with clear sputum. He states when he wakes up in the am's he feels tingling all over and wonders if he need so2 with sleep.   gradually worse since hot weather early July 2016 with doe x anything more than slow walk short distance = MMRC3 = can't walk 100 yards even at a slow pace at  a flat grade s stopping due to sob   Some better with saba, min assoc cough esp in ams when tends to use a lot of saba to get his day going  rec Add bystolic 5 mg daily  Please remember to go to the lab and x-ray department downstairs for your tests - we will call you with the results when they are available. Stop spiriva and start tudorza one twice daily after symbicort  And if happy fill the prescription, if not resume the spiriva and let us call you in the respimat form of spiriva to purchase before your next visit       10/02/2015  f/u ov/Kirk Anderson re: copd III better on symb/ spiriva respimat and on bystolic for bp  Chief Complaint  Patient presents with  . Follow-up    Pt states his breathing has improved back to baseline. He has not used albuterol recently.    Improving s need for so much albterol and now on symb/spiriva spray but still urge to clear throat, intermittent hoarseness = doe now [facility] = can walk nl pace, flat grade, can't hurry or go uphills or steps s sob   rec When you run out of the bystolic samples > bisoprolol  daily  Try  prilosec otc 20mg   Take 30-60 min before first meal of the day and Pepcid ac (famotidine) 20 mg one @  bedtime until return GERD diet   Please schedule a follow up visit in 3 months but call sooner if needed > bring all meds with you     01/04/2016  f/u ov/Kirk Anderson re: GOLD III copd / maint on spiriva respimat and symbicort 160 2bid >no meds  Chief Complaint  Patient presents with  . Follow-up    Pt states that his breathing is still doing well. He has still not had to use proair. No new co's today.   main issue is hoarseness/ chronic/ worse in am's  rec Increase the pepcid to 20 mg at bedtime for now to see what difference if any this makes in your voice/ hoarseness and Dr Kirk RuizJohn can do the follow up as to whether you need to see a throat specialist   03/16/2016  f/u ov/Kirk Anderson re: worse sob/ COPD GOLD III maint on symb/spirva Chief Complaint   Patient presents with  . Acute Visit    Pt c/o increased SOB. recently completed Pred and Doxy 02/17/16.  Pt feels breathing never returned to baseline.  no resp problems at hs/ ok at rest - wasn't using saba at all and now a couple of times a day, last about about 4 h prior to OV  X 6 weeks p rx for aecopd/uri rec Plan A = Automatic = Bevespi Take 2 puffs first thing in am and then another 2 puffs about 12 hours later. (stop stop symbicort/spiriva)  Plan B = Backup Only use your albuterol as a rescue medication    06/16/2016  f/u ov/Kirk Anderson re: GOLD III/ symb 160/spiriva p worse p 1 week or two of Bevespi  Chief Complaint  Patient presents with  . Follow-up    Breathing is doing well today. He notices occ wheezing "mucinex clears it up". He has not used albuterol in 2 months.   doe = MMRC2 = can't walk a nl pace on a flat grade s sob but does fine slow and flat eg   No obvious day to day or daytime variability or assoc excess/ purulent sputum or mucus plugs   or cp or chest tightness, subjective wheeze or overt sinus or hb symptoms. No unusual exp hx or h/o childhood pna/ asthma or knowledge of premature birth.  Sleeping ok without nocturnal   exacerbation  of respiratory  c/o's or need for noct saba. Also denies any obvious fluctuation of symptoms with weather or environmental changes or other aggravating or alleviating factors except as outlined above   Current Medications, Allergies, Complete Past Medical History, Past Surgical History, Family History, and Social History were reviewed in [facility] electronic medical record.  ROS  The following are not active complaints unless bolded sore throat, dysphagia, dental problems, itching, sneezing,  nasal congestion or excess/ purulent secretions, ear ache,   fever, chills, sweats, unintended wt loss, classically pleuritic or exertional cp, hemoptysis,  orthopnea pnd or leg swelling, presyncope, palpitations, abdominal pain, anorexia, nausea,  vomiting, diarrhea  or change in bowel or bladder habits, change in stools or urine, dysuria,hematuria,  rash, arthralgias, visual complaints, headache, numbness, weakness or ataxia or problems with walking or coordination,  change in mood/affect or memory.             Past Medical History:  EMPHYSEMA (ICD-492.8)  CHRONIC OBSTRUCTIVE PULMONARY DISEASE, MODERATE (ICD-496)  GERD  Coronary artery disease - minor by  cath 2000  Allergic rhinitis  Multiple rib fxs 3/20011          Objective:   Physical Exam   Amb wm nad minimally hoarse very loud talker - edentulous/ vital signs reviewed    wt 194 April 16, 2009 > 188 June 29, 2009 >   183 08/26/2011 > 10/07/2011  183 > 04/11/2012  180 > 178 09/04/2015 > 10/02/2015  182 >  01/04/2016 183 > 03/16/2016  180 > 06/16/2016   186   HEENT mild turbinate edema. Oropharynx no thrush or excess pnd or cobblestoning. No JVD or cervical adenopathy. Mild accessory muscle hypertrophy. Trachea midline, nl thryroid. Chest was hyperinflated by percussion with diminished breath sounds and moderate increased exp time without wheeze. Hoover sign positive at mid inspiration. Regular rate and rhythm without murmur gallop or rub or increase P2 or edema. Abd: no hsm, nl excursion. Ext warm without cyanosis or clubbing.     CXR PA and Lateral:   03/16/2016 :    I personally reviewed images and agree with radiology impression as follows:    Emphysema. No definite active process.     Assessment:     Outpatient Encounter Prescriptions as of 06/16/2016  Medication Sig  . aspirin 81 MG tablet Take 81 mg by mouth daily.    . Cetirizine HCl (ZYRTEC ALLERGY PO) Take by mouth every morning.  Marland Kitchen. dextromethorphan-guaiFENesin (MUCINEX DM) 30-600 MG per 12 hr tablet Take 1 tablet by mouth 2 (two) times daily as needed for cough.  . Multiple Vitamin (MULTIVITAMIN) tablet Take 1 tablet by mouth daily.    . naproxen sodium (ANAPROX) 220 MG tablet Take 440 mg by mouth 2 (two)  times daily as needed (for pain).   Marland Kitchen. omeprazole (PRILOSEC) 20 MG capsule Take 20 mg by mouth daily before breakfast.  . PROAIR HFA 108 (90 BASE) MCG/ACT inhaler INHALE 2 PUFFS INTO THE LUNGS EVERY 6 (SIX) HOURS AS NEEDED FOR WHEEZING OR SHORTNESS OF BREATH.  Marland Kitchen. SPIRIVA RESPIMAT 2.5 MCG/ACT AERS USE 2 INHALATIONS EVERY DAY AS DIRECTED  . SYMBICORT 160-4.5 MCG/ACT inhaler INHALE 2 PUFFS INTO THE LUNGS 2 (TWO) TIMES DAILY.  . bisoprolol (ZEBETA) 5 MG tablet TAKE 1 TABLET (5 MG TOTAL) BY MOUTH DAILY. (Patient not taking: Reported on 06/16/2016)  . [DISCONTINUED] Glycopyrrolate-Formoterol (BEVESPI AEROSPHERE) 9-4.8 MCG/ACT AERO Inhale 2 puffs into the lungs 2 (two) times daily.   No facility-administered encounter medications on file as of 06/16/2016.

## 2016-06-17 NOTE — Assessment & Plan Note (Addendum)
Quit smoking 1992 - PFT's 03/24/05   FEV1 1.37 ratio 42, 21% improvment in FVC after B2, DLC0 80  - PFT's 06/29/09 FEV1 1.19 ratio 29,   6 % better after B2, DLC0 73  - PFT's 10/07/2011  1.33 (43%) ratio 30 no better DLCO 82% so GOLD III  - Alpha one genotype  10/07/2011 >    MM -03/16/2016  extensive coaching HFA effectiveness =       90% > trial of bevespi > worse so changed back to symb/ spiriva respimat - 03/16/2016   Walked RA  2 laps @ 185 ft each stopped due to sob with sats 88%   Moderately severe but well compensated s flares on symb/spiriva minimal saba use and failed laba/lama so no change rx needed  I had an extended summary/ final discussion with the patient reviewing all relevant studies completed to date and  lasting 15 to 20 minutes of a 25 minute visit    Each maintenance medication was reviewed in detail including most importantly the difference between maintenance and prns and under what circumstances the prns are to be triggered using an action plan format that is not reflected in the computer generated alphabetically organized AVS.    Please see instructions for details which were reviewed in writing and the patient given a copy highlighting the part that I personally wrote and discussed at today's ov.   Pulmonary f/u can be prn at this point with refills per PCP unless new issues arise.

## 2016-06-27 ENCOUNTER — Other Ambulatory Visit: Payer: Self-pay | Admitting: Internal Medicine

## 2016-08-03 ENCOUNTER — Other Ambulatory Visit: Payer: Self-pay | Admitting: Internal Medicine

## 2016-09-05 ENCOUNTER — Other Ambulatory Visit: Payer: Self-pay | Admitting: Internal Medicine

## 2016-11-22 ENCOUNTER — Other Ambulatory Visit: Payer: Self-pay | Admitting: Internal Medicine

## 2016-11-24 ENCOUNTER — Other Ambulatory Visit: Payer: Self-pay | Admitting: Internal Medicine

## 2016-12-22 ENCOUNTER — Other Ambulatory Visit: Payer: Self-pay | Admitting: Internal Medicine

## 2017-01-23 ENCOUNTER — Other Ambulatory Visit: Payer: Self-pay | Admitting: Internal Medicine

## 2017-02-23 ENCOUNTER — Other Ambulatory Visit: Payer: Self-pay | Admitting: Internal Medicine

## 2017-02-24 ENCOUNTER — Other Ambulatory Visit: Payer: Self-pay | Admitting: Internal Medicine

## 2017-03-03 ENCOUNTER — Ambulatory Visit (INDEPENDENT_AMBULATORY_CARE_PROVIDER_SITE_OTHER): Payer: Medicare Other | Admitting: Internal Medicine

## 2017-03-03 ENCOUNTER — Other Ambulatory Visit (INDEPENDENT_AMBULATORY_CARE_PROVIDER_SITE_OTHER): Payer: Medicare Other

## 2017-03-03 ENCOUNTER — Encounter: Payer: Self-pay | Admitting: Internal Medicine

## 2017-03-03 VITALS — BP 126/88 | HR 92 | Temp 98.0°F | Ht 70.0 in | Wt 187.0 lb

## 2017-03-03 DIAGNOSIS — R7302 Impaired glucose tolerance (oral): Secondary | ICD-10-CM

## 2017-03-03 DIAGNOSIS — J449 Chronic obstructive pulmonary disease, unspecified: Secondary | ICD-10-CM | POA: Diagnosis not present

## 2017-03-03 DIAGNOSIS — R7989 Other specified abnormal findings of blood chemistry: Secondary | ICD-10-CM | POA: Diagnosis not present

## 2017-03-03 DIAGNOSIS — Z0001 Encounter for general adult medical examination with abnormal findings: Secondary | ICD-10-CM

## 2017-03-03 DIAGNOSIS — J309 Allergic rhinitis, unspecified: Secondary | ICD-10-CM | POA: Diagnosis not present

## 2017-03-03 DIAGNOSIS — Z1159 Encounter for screening for other viral diseases: Secondary | ICD-10-CM

## 2017-03-03 DIAGNOSIS — Z0000 Encounter for general adult medical examination without abnormal findings: Secondary | ICD-10-CM

## 2017-03-03 LAB — URINALYSIS, ROUTINE W REFLEX MICROSCOPIC
BILIRUBIN URINE: NEGATIVE
HGB URINE DIPSTICK: NEGATIVE
KETONES UR: NEGATIVE
LEUKOCYTES UA: NEGATIVE
NITRITE: NEGATIVE
PH: 7 (ref 5.0–8.0)
RBC / HPF: NONE SEEN (ref 0–?)
Specific Gravity, Urine: <=1.005 — AB (ref 1.000–1.030)
Total Protein, Urine: NEGATIVE
UROBILINOGEN UA: 0.2 (ref 0.0–1.0)
Urine Glucose: NEGATIVE

## 2017-03-03 LAB — BASIC METABOLIC PANEL
BUN: 7 mg/dL (ref 6–23)
CHLORIDE: 100 meq/L (ref 96–112)
CO2: 28 meq/L (ref 19–32)
Calcium: 9.8 mg/dL (ref 8.4–10.5)
Creatinine, Ser: 0.75 mg/dL (ref 0.40–1.50)
GFR: 109.11 mL/min (ref >60.00)
GLUCOSE: 95 mg/dL (ref 70–99)
POTASSIUM: 4.2 meq/L (ref 3.5–5.1)
SODIUM: 136 meq/L (ref 135–145)

## 2017-03-03 LAB — HEPATIC FUNCTION PANEL
ALBUMIN: 4.4 g/dL (ref 3.5–5.2)
ALK PHOS: 75 U/L (ref 39–117)
ALT: 15 U/L (ref 0–53)
AST: 20 U/L (ref 0–37)
Bilirubin, Direct: 0.1 mg/dL (ref 0.0–0.3)
Total Bilirubin: 0.6 mg/dL (ref 0.2–1.2)
Total Protein: 7.3 g/dL (ref 6.0–8.3)

## 2017-03-03 LAB — CBC WITH DIFFERENTIAL/PLATELET
Basophils Absolute: 0.1 10*3/uL (ref 0.0–0.1)
Basophils Relative: 0.9 % (ref 0.0–3.0)
EOS PCT: 6.2 % — AB (ref 0.0–5.0)
Eosinophils Absolute: 0.6 10*3/uL (ref 0.0–0.7)
HCT: 44.7 % (ref 39.0–52.0)
Hemoglobin: 15 g/dL (ref 13.0–17.0)
LYMPHS ABS: 3 10*3/uL (ref 0.7–4.0)
Lymphocytes Relative: 33.8 % (ref 12.0–46.0)
MCHC: 33.5 g/dL (ref 30.0–36.0)
MCV: 88.4 fl (ref 78.0–100.0)
MONO ABS: 0.6 10*3/uL (ref 0.1–1.0)
Monocytes Relative: 6.7 % (ref 3.0–12.0)
NEUTROS PCT: 52.4 % (ref 43.0–77.0)
Neutro Abs: 4.7 10*3/uL (ref 1.4–7.7)
Platelets: 314 10*3/uL (ref 150.0–400.0)
RBC: 5.05 Mil/uL (ref 4.22–5.81)
RDW: 13.5 % (ref 11.5–15.5)
WBC: 9 10*3/uL (ref 4.0–10.5)

## 2017-03-03 LAB — LIPID PANEL
Cholesterol: 198 mg/dL (ref 0–200)
HDL: 42.7 mg/dL (ref >39.00)
NonHDL: 155.56
Total CHOL/HDL Ratio: 5
Triglycerides: 258 mg/dL — ABNORMAL HIGH (ref 0.0–149.0)
VLDL: 51.6 mg/dL — AB (ref 0.0–40.0)

## 2017-03-03 LAB — TSH: TSH: 1.38 u[IU]/mL (ref 0.35–4.50)

## 2017-03-03 LAB — PSA: PSA: 1.76 ng/mL (ref 0.10–4.00)

## 2017-03-03 LAB — HEMOGLOBIN A1C: HEMOGLOBIN A1C: 5.9 % (ref 4.6–6.5)

## 2017-03-03 LAB — LDL CHOLESTEROL, DIRECT: LDL DIRECT: 116 mg/dL

## 2017-03-03 MED ORDER — TIOTROPIUM BROMIDE MONOHYDRATE 2.5 MCG/ACT IN AERS: INHALATION_SPRAY | RESPIRATORY_TRACT | 11 refills | Status: DC

## 2017-03-03 NOTE — Assessment & Plan Note (Signed)
stable overall by history and exam, recent data reviewed with pt, and pt to continue medical treatment as before,  to f/u any worsening symptoms or concern 

## 2017-03-03 NOTE — Progress Notes (Signed)
Subjective:    Patient ID: Kirk Anderson, male    DOB: [birth year], 71 y.o.   MRN: [id number]  HPI  Here for wellness and f/u;  Overall doing ok;  Pt denies Chest pain, worsening SOB, DOE, wheezing, orthopnea, PND, worsening LE edema, palpitations, dizziness or syncope.  Pt denies neurological change such as new headache, facial or extremity weakness.  Pt denies polydipsia, polyuria, or low sugar symptoms. Pt states overall good compliance with treatment and medications, good tolerability, and has been trying to follow appropriate diet.  Pt denies worsening depressive symptoms, suicidal ideation or panic. No fever, night sweats, wt loss, loss of appetite, or other constitutional symptoms.  Pt states good ability with ADL's, has low fall risk, home safety reviewed and adequate, no other significant changes in hearing or vision, and only occasionally active with exercise.  [place], though they keep leaving him.  Has not returned to smoking. Does have several wks ongoing nasal allergy symptoms with clearish congestion, itch and sneezing, without fever, pain, ST, cough, swelling or wheezing.  Past Medical History:  Diagnosis Date  . ALLERGIC RHINITIS 05/26/2008  . COPD (chronic obstructive pulmonary disease) (HCC)    PFT's 03/24/05 FEV1 1.37 ratio 42, 21% improvement in FVC after B2, DLCO 80 -PFT's 06/29/09 1.19 ratio 29 6% better after B2, DLC0 73 -HFA 75% 06/29/2009 >90% 08/10/2009  . CORONARY ARTERY DISEASE 05/26/2008   minor by cath 2000  . EMPHYSEMA 10/29/2007  . GERD 05/26/2008  . Hyperlipidemia   . Impaired glucose tolerance 04/08/2014  . Multiple rib fractures 02/2000   Past Surgical History:  Procedure Laterality Date  . APPENDECTOMY    . HEMORRHOID SURGERY      reports that he quit smoking about 26 years ago. His smoking use included Cigarettes. He has a 60.00 pack-year smoking history. His smokeless tobacco use includes Chew. He reports that he does  not drink alcohol or use drugs. family history includes Alcohol abuse in his brother; Aneurysm in his mother; Heart disease in his father. Allergies  Allergen Reactions  . Statins Other (See Comments)    weakness  . Sudafed [Pseudoephedrine Hcl] Other (See Comments)    Urinary retention   Current Outpatient Prescriptions on File Prior to Visit  Medication Sig Dispense Refill  . aspirin 81 MG tablet Take 81 mg by mouth daily.      . bisoprolol (ZEBETA) 5 MG tablet TAKE 1 TABLET (5 MG TOTAL) BY MOUTH DAILY. 30 tablet 2  . Cetirizine HCl (ZYRTEC ALLERGY PO) Take by mouth every morning.    Marland Kitchen dextromethorphan-guaiFENesin (MUCINEX DM) 30-600 MG per 12 hr tablet Take 1 tablet by mouth 2 (two) times daily as needed for cough.    . Multiple Vitamin (MULTIVITAMIN) tablet Take 1 tablet by mouth daily.      . naproxen sodium (ANAPROX) 220 MG tablet Take 440 mg by mouth 2 (two) times daily as needed (for pain).     Marland Kitchen omeprazole (PRILOSEC) 20 MG capsule Take 20 mg by mouth daily before breakfast.    . PROAIR HFA 108 (90 BASE) MCG/ACT inhaler INHALE 2 PUFFS INTO THE LUNGS EVERY 6 (SIX) HOURS AS NEEDED FOR WHEEZING OR SHORTNESS OF BREATH. 8.5 g 11  . SYMBICORT 160-4.5 MCG/ACT inhaler INHALE 2 PUFFS INTO THE LUNGS 2 (TWO) TIMES DAILY. 10.2 g 3   No current facility-administered medications on file prior to visit.    Review of Systems Constitutional: Negative for increased  diaphoresis, or other activity, appetite or siginficant weight change other than noted HENT: Negative for worsening hearing loss, ear pain, facial swelling, mouth sores and neck stiffness.   Eyes: Negative for other worsening pain, redness or visual disturbance.  Respiratory: Negative for choking or stridor Cardiovascular: Negative for other chest pain and palpitations.  Gastrointestinal: Negative for worsening diarrhea, blood in stool, or abdominal distention Genitourinary: Negative for hematuria, flank pain or change in urine volume.   Musculoskeletal: Negative for myalgias or other joint complaints.  Skin: Negative for other color change and wound or drainage.  Neurological: Negative for syncope and numbness. other than noted Hematological: Negative for adenopathy. or other swelling Psychiatric/Behavioral: Negative for hallucinations, SI, self-injury, decreased concentration or other worsening agitation.  All other system neg per pt    Objective:   Physical Exam BP 126/88   Pulse 92   Temp 98 F (36.7 C)   Ht 5\' 10"  (1.778 m)   Wt 187 lb (84.8 kg)   SpO2 95%   BMI 26.83 kg/m  VS noted, not ill appearing Constitutional: Pt is oriented to person, place, and time. Appears well-developed and well-nourished, in no significant distress Head: Normocephalic and atraumatic  Eyes: Conjunctivae and EOM are normal. Pupils are equal, round, and reactive to light Right Ear: External ear normal.  Left Ear: External ear normal Nose: Nose normal.  Bilat tm's with mild erythema.  Max sinus areas non tender.  Pharynx with mild erythema, no exudate Neck: Normal range of motion. Neck supple. No JVD present. No tracheal deviation present or significant neck LA or mass Cardiovascular: Normal rate, regular rhythm, normal heart sounds and intact distal pulses.   Pulmonary/Chest: Effort normal and breath sounds decresaed without rales or wheezing  Abdominal: Soft. Bowel sounds are normal. NT. No HSM  Musculoskeletal: Normal range of motion. Exhibits no edema Lymphadenopathy: Has no cervical adenopathy.  Neurological: Pt is alert and oriented to person, place, and time. Pt has normal reflexes. No cranial nerve deficit. Motor grossly intact Skin: Skin is warm and dry. No rash noted or new ulcers Psychiatric:  Has normal mood and affect. Behavior is normal.  No other exam findings Lab Results  Component Value Date   WBC 8.5 09/04/2015   HGB 15.2 09/04/2015   HCT 45.2 09/04/2015   PLT 228.0 09/04/2015   GLUCOSE 90 09/04/2015   CHOL  221 (H) 03/31/2015   TRIG 247.0 (H) 03/31/2015   HDL 47.30 03/31/2015   LDLDIRECT 135.0 03/31/2015   LDLCALC 102 (H) 04/08/2014   ALT 16 03/31/2015   AST 20 03/31/2015   NA 139 09/04/2015   K 4.6 09/04/2015   CL 102 09/04/2015   CREATININE 0.81 09/04/2015   BUN 11 09/04/2015   CO2 30 09/04/2015   TSH 1.90 09/04/2015   PSA 1.88 03/31/2015   HGBA1C 5.6 03/31/2015       Assessment & Plan:

## 2017-03-03 NOTE — Assessment & Plan Note (Signed)

## 2017-03-03 NOTE — Assessment & Plan Note (Signed)
Ok for Best Buynasacort otc

## 2017-03-03 NOTE — Patient Instructions (Addendum)
OK to try OTC nasacort for allergies  Please continue all other medications as before, and refills have been done if requested.  Please have the pharmacy call with any other refills you may need.  Please continue your efforts at being more active, low cholesterol diet, and weight control.  You are otherwise up to date with prevention measures today.  Please keep your appointments with your specialists as you may have planned  Please go to the LAB in the Basement (turn left off the elevator) for the tests to be done today  You will be contacted by phone if any changes need to be made immediately.  Otherwise, you will receive a letter about your results with an explanation, but please check with MyChart first.  Please remember to sign up for MyChart if you have not done so, as this will be important to you in the future with finding out test results, communicating by private email, and scheduling acute appointments online when needed.  Please return in 1 year for your yearly visit, or sooner if needed, with Lab testing done 3-5 days before

## 2017-03-04 LAB — HEPATITIS C ANTIBODY: HCV AB: NEGATIVE

## 2017-05-03 ENCOUNTER — Other Ambulatory Visit: Payer: Self-pay | Admitting: Internal Medicine

## 2017-08-18 NOTE — Progress Notes (Signed)
Pre visit review using our clinic review tool, if applicable. No additional management support is needed unless otherwise documented below in the visit note. 

## 2017-08-18 NOTE — Progress Notes (Addendum)
Subjective:   Kirk Anderson is a 71 y.o. male who presents for Medicare Annual/Subsequent preventive examination.  Review of Systems:  No ROS.  Medicare Wellness Visit. Additional risk factors are reflected in the social history.  Cardiac Risk Factors include: advanced age (>8men, >57 women);hypertension;male gender Sleep patterns: feels rested on waking, gets up 1-2 times nightly to void and sleeps 6-7 hours nightly.    Home Safety/Smoke Alarms: Feels safe in home. Smoke alarms in place.  Living environment; residence and Firearm Safety: 2-story house, no firearms Lives with wife,  no needs for DME, good support system. Seat Belt Safety/Bike Helmet: Wears seat belt.   Male:   CCS-     PSA-  Lab Results  Component Value Date   PSA 1.76 03/03/2017   PSA 1.88 03/31/2015   PSA 1.77 04/08/2014       Objective:    Vitals: BP 124/76   Pulse 88   Resp 20   Ht 5\' 10"  (1.778 m)   Wt 188 lb (85.3 kg)   SpO2 96%   BMI 26.98 kg/m   Body mass index is 26.98 kg/m.  Tobacco History  Smoking Status  . Former Smoker  . Packs/day: 2.00  . Years: 30.00  . Types: Cigarettes  . Quit date: 12/19/1990  Smokeless Tobacco  . Current User  . Types: Chew     Ready to quit: Not Answered Counseling given: Not Answered   Past Medical History:  Diagnosis Date  . ALLERGIC RHINITIS 05/26/2008  . COPD (chronic obstructive pulmonary disease) (HCC)    PFT's 03/24/05 FEV1 1.37 ratio 42, 21% improvement in FVC after B2, DLCO 80 -PFT's 06/29/09 1.19 ratio 29 6% better after B2, DLC0 73 -HFA 75% 06/29/2009 >90% 08/10/2009  . CORONARY ARTERY DISEASE 05/26/2008   minor by cath 2000  . EMPHYSEMA 10/29/2007  . GERD 05/26/2008  . Hyperlipidemia   . Impaired glucose tolerance 04/08/2014  . Multiple rib fractures 02/2000   Past Surgical History:  Procedure Laterality Date  . APPENDECTOMY    . HEMORRHOID SURGERY     Family History  Problem Relation Age of Onset  . Aneurysm Mother    Vacular Aneurysm  . Heart disease Father        CAD/Father had MI in his 60's  . Alcohol abuse Brother   . Colon cancer Neg Hx    History  Sexual Activity  . Sexual activity: Not on file    Outpatient Encounter Prescriptions as of 08/22/2017  Medication Sig  . aspirin 81 MG tablet Take 81 mg by mouth daily.    . Cetirizine HCl (ZYRTEC ALLERGY PO) Take by mouth every morning.  Marland Kitchen dextromethorphan-guaiFENesin (MUCINEX DM) 30-600 MG per 12 hr tablet Take 1 tablet by mouth 2 (two) times daily as needed for cough.  . Multiple Vitamin (MULTIVITAMIN) tablet Take 1 tablet by mouth daily.    . naproxen sodium (ANAPROX) 220 MG tablet Take 440 mg by mouth 2 (two) times daily as needed (for pain).   Marland Kitchen omeprazole (PRILOSEC) 20 MG capsule Take 20 mg by mouth daily before breakfast.  . PROAIR HFA 108 (90 BASE) MCG/ACT inhaler INHALE 2 PUFFS INTO THE LUNGS EVERY 6 (SIX) HOURS AS NEEDED FOR WHEEZING OR SHORTNESS OF BREATH.  . SYMBICORT 160-4.5 MCG/ACT inhaler INHALE 2 PUFFS INTO THE LUNGS 2 (TWO) TIMES DAILY.  Marland Kitchen Tiotropium Bromide Monohydrate (SPIRIVA RESPIMAT) 2.5 MCG/ACT AERS USE 2 INHALATIONS EVERY DAY AS DIRECTED  . [DISCONTINUED] bisoprolol (ZEBETA) 5 MG  tablet TAKE 1 TABLET (5 MG TOTAL) BY MOUTH DAILY. (Patient not taking: Reported on 08/22/2017)   No facility-administered encounter medications on file as of 08/22/2017.     Activities of Daily Living In your present state of health, do you have any difficulty performing the following activities: 08/22/2017  Hearing? N  Vision? N  Difficulty concentrating or making decisions? N  Walking or climbing stairs? N  Dressing or bathing? N  Doing errands, shopping? N  Preparing Food and eating ? N  Using the Toilet? N  In the past six months, have you accidently leaked urine? N  Do you have problems with loss of bowel control? N  Managing your Medications? N  Managing your Finances? N  Housekeeping or managing your Housekeeping? N  Some recent data  might be hidden    Patient Care Team: Corwin LevinsJohn, James W, MD as PCP - General   Assessment:    Physical assessment deferred to PCP.  Exercise Activities and Dietary recommendations Current Exercise Habits: The patient has a physically strenous job, but has no regular exercise apart from work. (has 10 acre property) Diet (meal preparation, eat out, water intake, caffeinated beverages, dairy products, fruits and vegetables): in general, a "healthy" diet  , well balanced   Reviewed heart healthy, encouraged patient to increase daily water intake.   Goals      Patient Stated   . patient (pt-stated)          Continue to active and fup with pulmonary      Other   . <enter goal here>    . maintain current health status          Stay active, eat healthy, enjoy life, family and church      Fall Risk Fall Risk  08/22/2017 03/03/2017 01/04/2016 03/31/2015 04/08/2014  Falls in the past year? No No No No No   Depression Screen PHQ 2/9 Scores 08/22/2017 03/03/2017 01/04/2016 03/31/2015  PHQ - 2 Score 0 0 0 0  PHQ- 9 Score 0 - - -    Cognitive Function MMSE - Mini Mental State Exam 08/22/2017  Orientation to time 5  Orientation to Place 5  Registration 3  Attention/ Calculation 5  Recall 2  Language- name 2 objects 2  Language- repeat 1  Language- follow 3 step command 3  Language- read & follow direction 1  Write a sentence 1  Copy design 1  Total score 29        Immunization History  Administered Date(s) Administered  . Pneumococcal Conjugate-13 01/02/2014  . Pneumococcal Polysaccharide-23 02/12/2013  . Td 12/19/1993  . Tdap 02/12/2013   Screening Tests Health Maintenance  Topic Date Due  . INFLUENZA VACCINE  07/19/2017  . TETANUS/TDAP  02/12/2023  . COLONOSCOPY  04/12/2023  . Hepatitis C Screening  Completed  . PNA vac Low Risk Adult  Completed      Plan:    Continue doing brain stimulating activities (puzzles, reading, adult coloring books, staying active) to keep  memory sharp.   Continue to eat heart healthy diet (full of fruits, vegetables, whole grains, lean protein, water--limit salt, fat, and sugar intake) and increase physical activity as tolerated.  I have personally reviewed and noted the following in the patient's chart:   . Medical and social history . Use of alcohol, tobacco or illicit drugs  . Current medications and supplements . Functional ability and status . Nutritional status . Physical activity . Advanced directives . List of other physicians .  Vitals . Screenings to include cognitive, depression, and falls . Referrals and appointments  In addition, I have reviewed and discussed with patient certain preventive protocols, quality metrics, and best practice recommendations. A written personalized care plan for preventive services as well as general preventive health recommendations were provided to patient.     Wanda Plump, RN  08/22/2017  Medical screening examination/treatment/procedure(s) were performed by non-physician practitioner and as supervising physician I was immediately available for consultation/collaboration. I agree with above. Oliver Barre, MD

## 2017-08-22 ENCOUNTER — Ambulatory Visit (INDEPENDENT_AMBULATORY_CARE_PROVIDER_SITE_OTHER): Payer: Medicare Other | Admitting: *Deleted

## 2017-08-22 VITALS — BP 124/76 | HR 88 | Resp 20 | Ht 70.0 in | Wt 188.0 lb

## 2017-08-22 DIAGNOSIS — Z0000 Encounter for general adult medical examination without abnormal findings: Secondary | ICD-10-CM

## 2017-08-22 NOTE — Patient Instructions (Signed)
Continue doing brain stimulating activities (puzzles, reading, adult coloring books, staying active) to keep memory sharp.   Continue to eat heart healthy diet (full of fruits, vegetables, whole grains, lean protein, water--limit salt, fat, and sugar intake) and increase physical activity as tolerated.   Kirk Anderson , Thank you for taking time to come for your Medicare Wellness Visit. I appreciate your ongoing commitment to your health goals. Please review the following plan we discussed and let me know if I can assist you in the future.   These are the goals we discussed: Goals      Patient Stated   . patient (pt-stated)          Continue to active and fup with pulmonary      Other   . <enter goal here>    . maintain current health status          Stay active, eat healthy, enjoy life, family and church       This is a list of the screening recommended for you and due dates:  Health Maintenance  Topic Date Due  . Flu Shot  07/19/2017  . Tetanus Vaccine  02/12/2023  . Colon Cancer Screening  04/12/2023  .  Hepatitis C: One time screening is recommended by Center for Disease Control  (CDC) for  adults born from [birth year] through 1965.   Completed  . Pneumonia vaccines  Completed

## 2017-10-23 ENCOUNTER — Other Ambulatory Visit: Payer: Self-pay | Admitting: Internal Medicine

## 2018-01-23 ENCOUNTER — Other Ambulatory Visit: Payer: Self-pay | Admitting: Internal Medicine

## 2018-02-16 IMAGING — CR DG CHEST 2V
2 series · 2 of 2 positions shown · non-contrast
Comparison: Chest x-ray of 09/04/2015

CLINICAL DATA: Productive cough congestion for 1 week, history of
emphysema, former smoking history

EXAM:
CHEST  2 VIEW

[w chest pa]
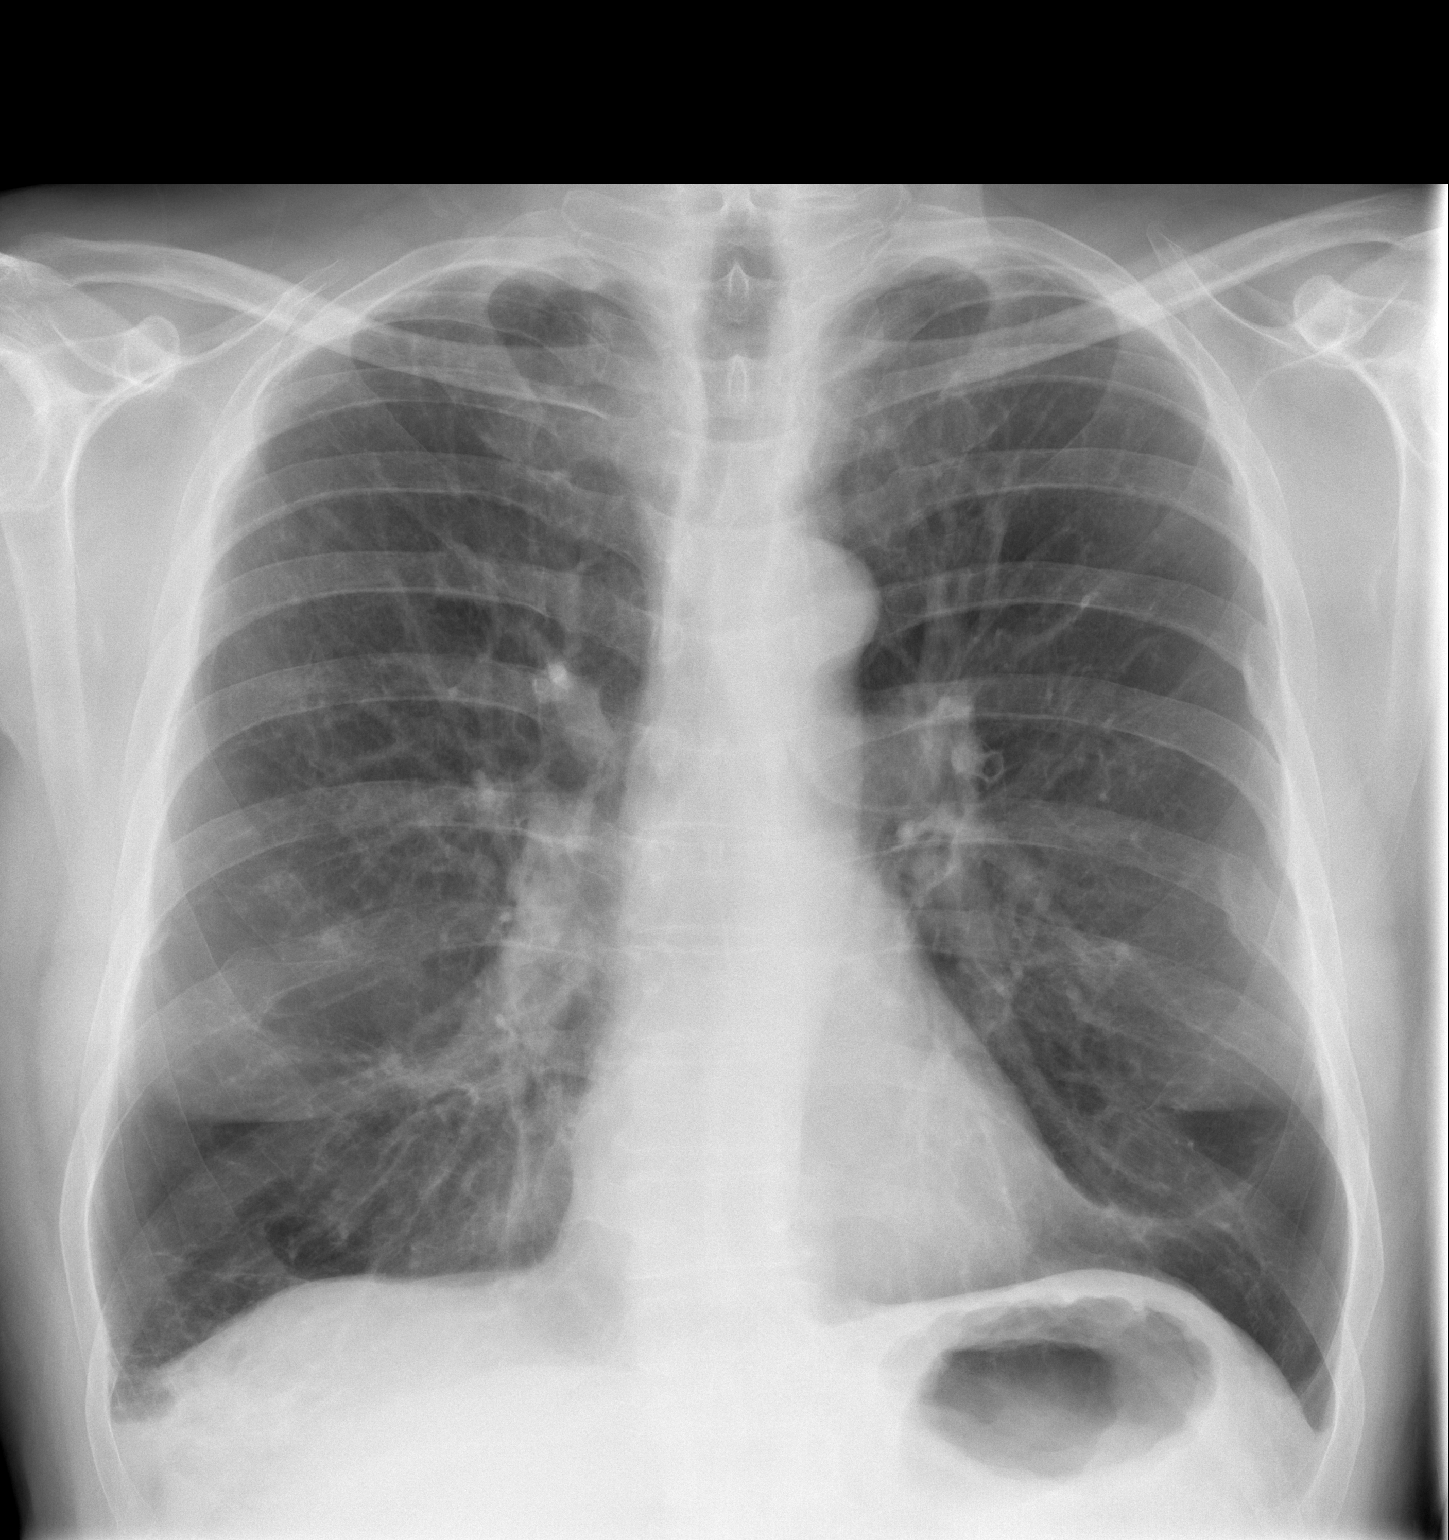

[w chest lat]
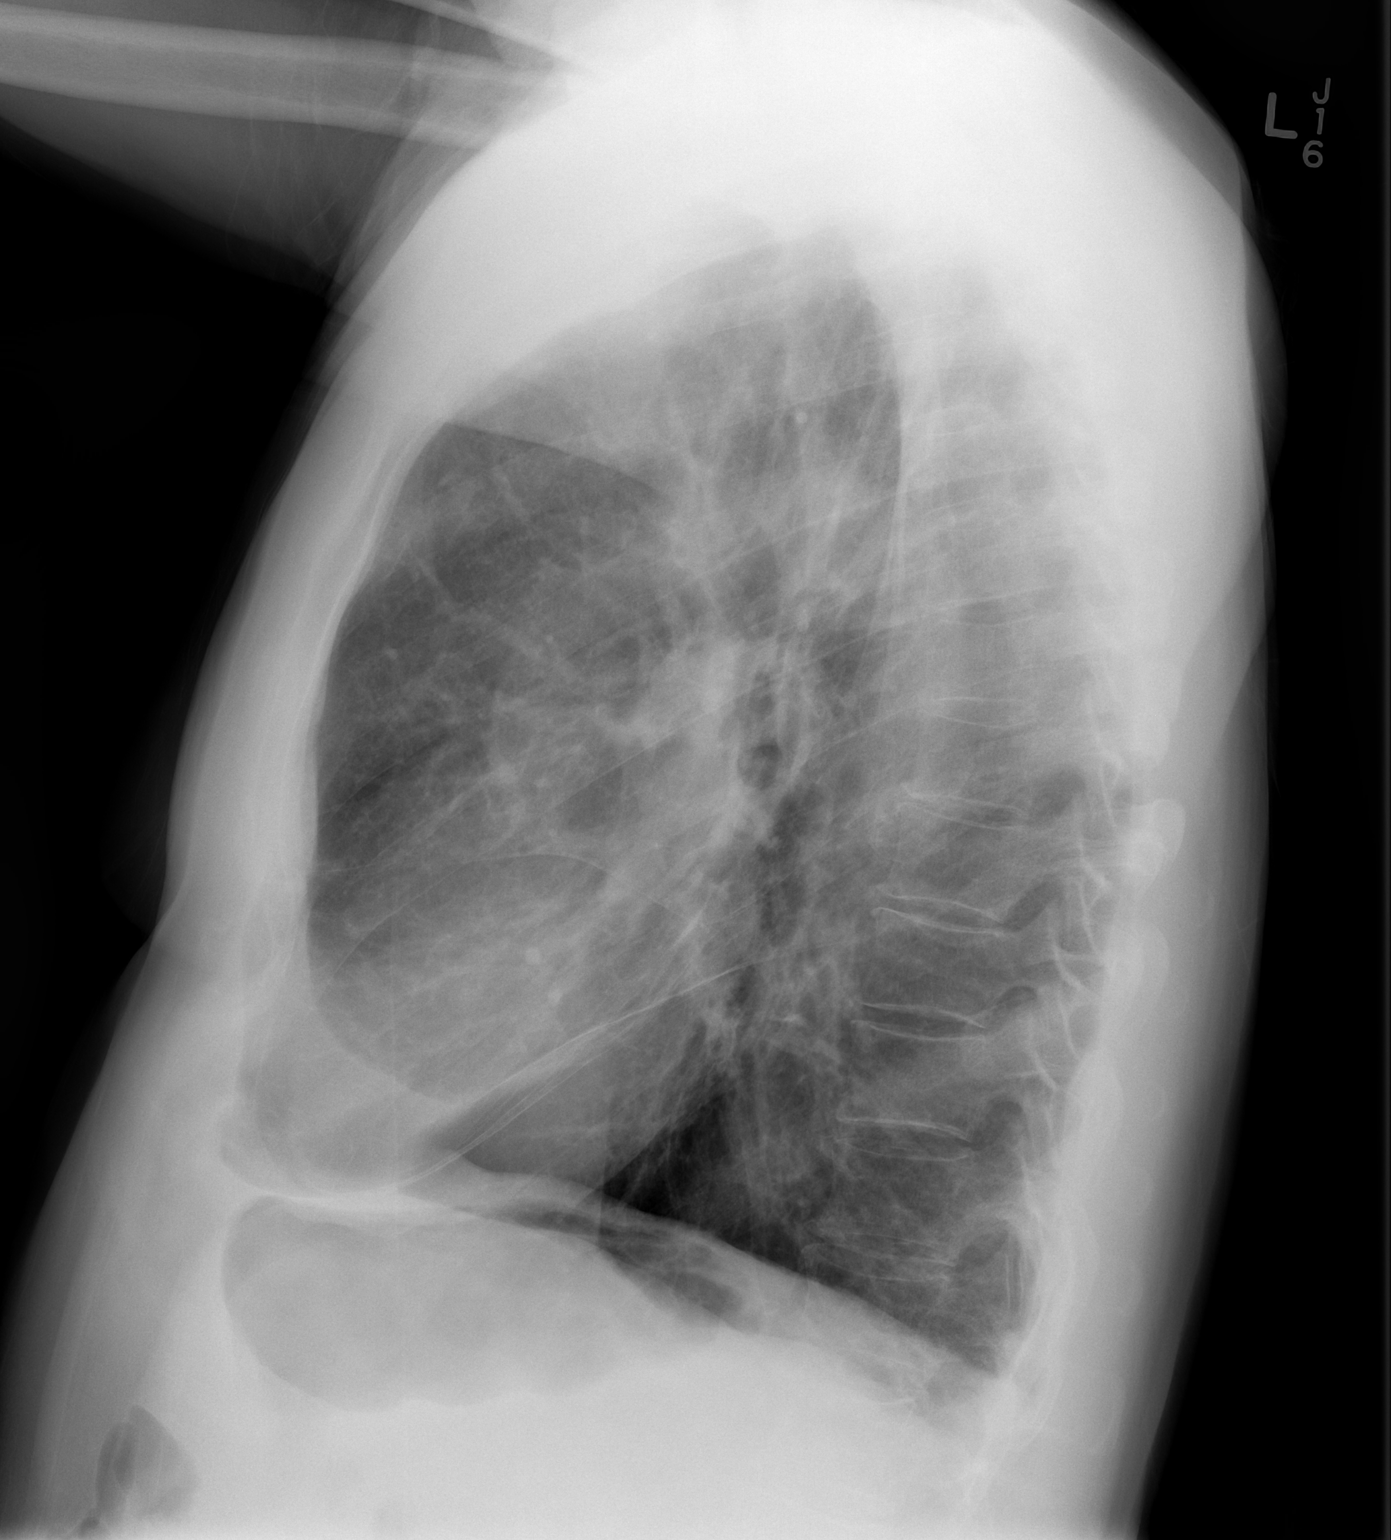

[2 of 2 positions shown; findings below may reference images not displayed]

FINDINGS: No active infiltrate or effusion is seen. The lungs are hyperaerated
with flattened hemidiaphragms suggests and element of emphysema.
Mediastinal and hilar contours are unremarkable. The heart is within
normal limits in size. No acute bony abnormality is seen. Old left
lateral rib fractures are noted.
IMPRESSION: Emphysema.  No definite active process.

## 2018-03-05 ENCOUNTER — Other Ambulatory Visit: Payer: Self-pay | Admitting: Internal Medicine

## 2018-03-07 ENCOUNTER — Encounter: Payer: Self-pay | Admitting: Internal Medicine

## 2018-03-07 ENCOUNTER — Ambulatory Visit: Payer: Medicare Other | Admitting: Internal Medicine

## 2018-03-07 ENCOUNTER — Other Ambulatory Visit (INDEPENDENT_AMBULATORY_CARE_PROVIDER_SITE_OTHER): Payer: Medicare Other

## 2018-03-07 ENCOUNTER — Other Ambulatory Visit: Payer: Self-pay | Admitting: Internal Medicine

## 2018-03-07 VITALS — BP 122/78 | HR 84 | Temp 97.8°F | Ht 70.0 in | Wt 186.0 lb

## 2018-03-07 DIAGNOSIS — I10 Essential (primary) hypertension: Secondary | ICD-10-CM | POA: Diagnosis not present

## 2018-03-07 DIAGNOSIS — Z0000 Encounter for general adult medical examination without abnormal findings: Secondary | ICD-10-CM

## 2018-03-07 DIAGNOSIS — R7302 Impaired glucose tolerance (oral): Secondary | ICD-10-CM

## 2018-03-07 LAB — CBC WITH DIFFERENTIAL/PLATELET
BASOS PCT: 1.3 % (ref 0.0–3.0)
Basophils Absolute: 0.1 10*3/uL (ref 0.0–0.1)
EOS PCT: 4.8 % (ref 0.0–5.0)
Eosinophils Absolute: 0.4 10*3/uL (ref 0.0–0.7)
HEMATOCRIT: 45.7 % (ref 39.0–52.0)
HEMOGLOBIN: 15.2 g/dL (ref 13.0–17.0)
LYMPHS PCT: 34.2 % (ref 12.0–46.0)
Lymphs Abs: 2.6 10*3/uL (ref 0.7–4.0)
MCHC: 33.3 g/dL (ref 30.0–36.0)
MCV: 89.2 fl (ref 78.0–100.0)
MONOS PCT: 7.3 % (ref 3.0–12.0)
Monocytes Absolute: 0.6 10*3/uL (ref 0.1–1.0)
Neutro Abs: 4 10*3/uL (ref 1.4–7.7)
Neutrophils Relative %: 52.4 % (ref 43.0–77.0)
Platelets: 279 10*3/uL (ref 150.0–400.0)
RBC: 5.12 Mil/uL (ref 4.22–5.81)
RDW: 13.2 % (ref 11.5–15.5)
WBC: 7.6 10*3/uL (ref 4.0–10.5)

## 2018-03-07 LAB — URINALYSIS, ROUTINE W REFLEX MICROSCOPIC
Bilirubin Urine: NEGATIVE
Hgb urine dipstick: NEGATIVE
Ketones, ur: NEGATIVE
Leukocytes, UA: NEGATIVE
Nitrite: NEGATIVE
PH: 6 (ref 5.0–8.0)
SPECIFIC GRAVITY, URINE: 1.01 (ref 1.000–1.030)
Total Protein, Urine: NEGATIVE
Urine Glucose: NEGATIVE
Urobilinogen, UA: 0.2 (ref 0.0–1.0)

## 2018-03-07 LAB — BASIC METABOLIC PANEL
BUN: 11 mg/dL (ref 6–23)
CO2: 27 mEq/L (ref 19–32)
Calcium: 10 mg/dL (ref 8.4–10.5)
Chloride: 103 mEq/L (ref 96–112)
Creatinine, Ser: 0.76 mg/dL (ref 0.40–1.50)
GFR: 107.14 mL/min (ref >60.00)
Glucose, Bld: 107 mg/dL — ABNORMAL HIGH (ref 70–99)
Potassium: 4.3 mEq/L (ref 3.5–5.1)
SODIUM: 141 meq/L (ref 135–145)

## 2018-03-07 LAB — LIPID PANEL
Cholesterol: 199 mg/dL (ref 0–200)
HDL: 51.2 mg/dL (ref >39.00)
NonHDL: 148.07
Total CHOL/HDL Ratio: 4
Triglycerides: 252 mg/dL — ABNORMAL HIGH (ref 0.0–149.0)
VLDL: 50.4 mg/dL — ABNORMAL HIGH (ref 0.0–40.0)

## 2018-03-07 LAB — HEPATIC FUNCTION PANEL
ALT: 12 U/L (ref 0–53)
AST: 20 U/L (ref 0–37)
Albumin: 4.8 g/dL (ref 3.5–5.2)
Alkaline Phosphatase: 57 U/L (ref 39–117)
BILIRUBIN DIRECT: 0.1 mg/dL (ref 0.0–0.3)
TOTAL PROTEIN: 7.2 g/dL (ref 6.0–8.3)
Total Bilirubin: 0.7 mg/dL (ref 0.2–1.2)

## 2018-03-07 LAB — PSA: PSA: 2.12 ng/mL (ref 0.10–4.00)

## 2018-03-07 LAB — TSH: TSH: 1.41 u[IU]/mL (ref 0.35–4.50)

## 2018-03-07 LAB — HEMOGLOBIN A1C: HEMOGLOBIN A1C: 5.8 % (ref 4.6–6.5)

## 2018-03-07 LAB — LDL CHOLESTEROL, DIRECT: LDL DIRECT: 120 mg/dL

## 2018-03-07 MED ORDER — ROSUVASTATIN CALCIUM 10 MG PO TABS: 10.0000 mg | ORAL_TABLET | Freq: Every day | ORAL | 3 refills | Status: DC

## 2018-03-07 NOTE — Patient Instructions (Signed)
You may wish to ask about Trelegy for you to take the place of spiriva and symbicort due to the coming donut hole  Please continue all other medications as before, and refills have been done if requested.  Please have the pharmacy call with any other refills you may need.  Please continue your efforts at being more active, low cholesterol diet, and weight control.  You are otherwise up to date with prevention measures today.  Please keep your appointments with your specialists as you may have planned  Please go to the LAB in the Basement (turn left off the elevator) for the tests to be done today  You will be contacted by phone if any changes need to be made immediately.  Otherwise, you will receive a letter about your results with an explanation, but please check with MyChart first.  Please remember to sign up for MyChart if you have not done so, as this will be important to you in the future with finding out test results, communicating by private email, and scheduling acute appointments online when needed.  Please return in 1 year for your yearly visit, or sooner if needed, with Lab testing done 3-5 days before

## 2018-03-07 NOTE — Progress Notes (Signed)
Subjective:    Patient ID: Kirk MorrowDouglas M Shelley, male    DOB: 09/11/1946, 72 y.o.   MRN: [id number]  HPI  Here for wellness and f/u;  Overall doing ok;  Pt denies Chest pain, worsening SOB, DOE, wheezing, orthopnea, PND, worsening LE edema, palpitations, dizziness or syncope.  Pt denies neurological change such as new headache, facial or extremity weakness.  Pt denies polydipsia, polyuria, or low sugar symptoms. Pt states overall good compliance with treatment and medications, good tolerability, and has been trying to follow appropriate diet.  Pt denies worsening depressive symptoms, suicidal ideation or panic. No fever, night sweats, wt loss, loss of appetite, or other constitutional symptoms.  Pt states good ability with ADL's, has low fall risk, home safety reviewed and adequate, no other significant changes in hearing or vision, and only occasionally active with exercise.  Has some  Bruising to arms occasionally on ASA 81.   Wt Readings from Last 3 Encounters:  03/07/18 186 lb (84.4 kg)  08/22/17 188 lb (85.3 kg)  03/03/17 187 lb (84.8 kg)   BP Readings from Last 3 Encounters:  03/07/18 122/78  08/22/17 124/76  03/03/17 126/88  Does have RUE numbness occas with known c spine DJD and has been rx neck traction in past, is going to restart this.  No UE pain or weakness, just numbness mild intermittent Past Medical History:  Diagnosis Date  . ALLERGIC RHINITIS 05/26/2008  . COPD (chronic obstructive pulmonary disease) (HCC)    PFT's 03/24/05 FEV1 1.37 ratio 42, 21% improvement in FVC after B2, DLCO 80 -PFT's 06/29/09 1.19 ratio 29 6% better after B2, DLC0 73 -HFA 75% 06/29/2009 >90% 08/10/2009  . CORONARY ARTERY DISEASE 05/26/2008   minor by cath 2000  . EMPHYSEMA 10/29/2007  . GERD 05/26/2008  . Hyperlipidemia   . Impaired glucose tolerance 04/08/2014  . Multiple rib fractures 02/2000   Past Surgical History:  Procedure Laterality Date  . APPENDECTOMY    . HEMORRHOID SURGERY      reports  that he quit smoking about 27 years ago. His smoking use included cigarettes. He has a 60.00 pack-year smoking history. His smokeless tobacco use includes chew. He reports that he does not drink alcohol or use drugs. family history includes Alcohol abuse in his brother; Aneurysm in his mother; Heart disease in his father. Allergies  Allergen Reactions  . Statins Other (See Comments)    weakness  . Sudafed [Pseudoephedrine Hcl] Other (See Comments)    Urinary retention   Current Outpatient Medications on File Prior to Visit  Medication Sig Dispense Refill  . aspirin 81 MG tablet Take 81 mg by mouth daily.      . Cetirizine HCl (ZYRTEC ALLERGY PO) Take by mouth every morning.    Marland Kitchen. dextromethorphan-guaiFENesin (MUCINEX DM) 30-600 MG per 12 hr tablet Take 1 tablet by mouth 2 (two) times daily as needed for cough.    . Multiple Vitamin (MULTIVITAMIN) tablet Take 1 tablet by mouth daily.      . naproxen sodium (ANAPROX) 220 MG tablet Take 440 mg by mouth 2 (two) times daily as needed (for pain).     Marland Kitchen. omeprazole (PRILOSEC) 20 MG capsule Take 20 mg by mouth daily before breakfast.    . PROAIR HFA 108 (90 BASE) MCG/ACT inhaler INHALE 2 PUFFS INTO THE LUNGS EVERY 6 (SIX) HOURS AS NEEDED FOR WHEEZING OR SHORTNESS OF BREATH. 8.5 g 11  . SPIRIVA RESPIMAT 2.5 MCG/ACT AERS USE 2 INHALATIONS EVERY DAY AS DIRECTED  4 g 11  . SYMBICORT 160-4.5 MCG/ACT inhaler INHALE 2 PUFFS INTO THE LUNGS 2 (TWO) TIMES DAILY. 10.2 g 3   No current facility-administered medications on file prior to visit.    Review of Systems Constitutional: Negative for other unusual diaphoresis, sweats, appetite or weight changes HENT: Negative for other worsening hearing loss, ear pain, facial swelling, mouth sores or neck stiffness.   Eyes: Negative for other worsening pain, redness or other visual disturbance.  Respiratory: Negative for other stridor or swelling Cardiovascular: Negative for other palpitations or other chest pain    Gastrointestinal: Negative for worsening diarrhea or loose stools, blood in stool, distention or other pain Genitourinary: Negative for hematuria, flank pain or other change in urine volume.  Musculoskeletal: Negative for myalgias or other joint swelling.  Skin: Negative for other color change, or other wound or worsening drainage.  Neurological: Negative for other syncope or numbness. Hematological: Negative for other adenopathy or swelling Psychiatric/Behavioral: Negative for hallucinations, other worsening agitation, SI, self-injury, or new decreased concentration All other system neg oer pt    Objective:   Physical Exam BP 122/78   Pulse 84   Temp 97.8 F (36.6 C) (Oral)   Ht 5\' 10"  (1.778 m)   Wt 186 lb (84.4 kg)   SpO2 96%   BMI 26.69 kg/m   VS noted,  Constitutional: Pt is oriented to person, place, and time. Appears well-developed and well-nourished, in no significant distress and comfortable Head: Normocephalic and atraumatic  Eyes: Conjunctivae and EOM are normal. Pupils are equal, round, and reactive to light Right Ear: External ear normal without discharge Left Ear: External ear normal without discharge Nose: Nose without discharge or deformity Mouth/Throat: Oropharynx is without other ulcerations and moist  Neck: Normal range of motion. Neck supple. No JVD present. No tracheal deviation present or significant neck LA or mass Cardiovascular: Normal rate, regular rhythm, normal heart sounds and intact distal pulses.  Pulmonary/Chest: WOB normal and breath sounds without rales or wheezing  Abdominal: Soft. Bowel sounds are normal. NT. No HSM  Musculoskeletal: Normal range of motion. Exhibits no edema Lymphadenopathy: Has no other cervical adenopathy.  Neurological: Pt is alert and oriented to person, place, and time. Pt has normal reflexes. No cranial nerve deficit. Motor grossly intact, Gait intact Skin: Skin is warm and dry. No rash noted or new ulcerations but  several arm purpura Psychiatric:  Has normal mood and affect. Behavior is normal without agitation No other exam findings  Lab Results  Component Value Date   WBC 9.0 03/03/2017   HGB 15.0 03/03/2017   HCT 44.7 03/03/2017   PLT 314.0 03/03/2017   GLUCOSE 95 03/03/2017   CHOL 198 03/03/2017   TRIG 258.0 (H) 03/03/2017   HDL 42.70 03/03/2017   LDLDIRECT 116.0 03/03/2017   LDLCALC 102 (H) 04/08/2014   ALT 15 03/03/2017   AST 20 03/03/2017   NA 136 03/03/2017   K 4.2 03/03/2017   CL 100 03/03/2017   CREATININE 0.75 03/03/2017   BUN 7 03/03/2017   CO2 28 03/03/2017   TSH 1.38 03/03/2017   PSA 1.76 03/03/2017   HGBA1C 5.9 03/03/2017      Assessment & Plan:

## 2018-03-08 ENCOUNTER — Telehealth: Payer: Self-pay

## 2018-03-08 NOTE — Telephone Encounter (Signed)
-----   Message from Corwin LevinsJames W [name], MD sent at 03/07/2018  6:19 PM EDT ----- Left message on MyChart, pt to cont same tx except  he test results show that your current treatment is OK, except the LDL cholesterol is mild elevated.  We should start a low dose statin called Crestor 10 mg per day to help reduce your future heart and stroke risk.  You should also hear from the office about this as well.    Shirron to please inform pt, I will do rx

## 2018-03-08 NOTE — Telephone Encounter (Signed)
Pt viewed results via MyChart

## 2018-03-10 ENCOUNTER — Encounter: Payer: Self-pay | Admitting: Internal Medicine

## 2018-03-10 NOTE — Assessment & Plan Note (Signed)

## 2018-03-10 NOTE — Assessment & Plan Note (Signed)
stable overall by history and exam, recent data reviewed with pt, and pt to continue medical treatment as before,  to f/u any worsening symptoms or concerns  

## 2018-03-10 NOTE — Assessment & Plan Note (Signed)
Lab Results  Component Value Date   HGBA1C 5.8 03/07/2018  stable overall by history and exam, recent data reviewed with pt, and pt to continue medical treatment as before,  to f/u any worsening symptoms or concerns 

## 2018-04-17 ENCOUNTER — Ambulatory Visit: Payer: Medicare Other | Admitting: Internal Medicine

## 2018-04-17 ENCOUNTER — Encounter: Payer: Self-pay | Admitting: Internal Medicine

## 2018-04-17 VITALS — BP 122/82 | HR 68 | Ht 70.0 in | Wt 180.0 lb

## 2018-04-17 DIAGNOSIS — R05 Cough: Secondary | ICD-10-CM

## 2018-04-17 DIAGNOSIS — R058 Other specified cough: Secondary | ICD-10-CM

## 2018-04-17 DIAGNOSIS — J449 Chronic obstructive pulmonary disease, unspecified: Secondary | ICD-10-CM | POA: Diagnosis not present

## 2018-04-17 MED ORDER — PANTOPRAZOLE SODIUM 40 MG PO TBEC: 40.0000 mg | DELAYED_RELEASE_TABLET | Freq: Every day | ORAL | 2 refills | Status: AC

## 2018-04-17 MED ORDER — FAMOTIDINE 20 MG PO TABS: ORAL_TABLET | ORAL | 11 refills | Status: AC

## 2018-04-17 NOTE — Patient Instructions (Addendum)
Try symbicort first thing in am / spiriva 2 pffs after that    Pantoprazole (protonix) 40 mg   Take  30-60 min before first meal of the day and Pepcid (famotidine)  20 mg one @  bedtime until return to office - this is the best way to tell whether stomach acid is contributing to your problem.    GERD (REFLUX)  is an extremely common cause of respiratory symptoms just like yours , many times with no obvious heartburn at all.    It can be treated with medication, but also with lifestyle changes including elevation of the head of your bed (ideally with 6 inch  bed blocks),  Smoking cessation, avoidance of late meals, excessive alcohol, and avoid fatty foods, chocolate, peppermint, colas, red wine, and acidic juices such as orange juice.  NO MINT OR MENTHOL PRODUCTS SO NO COUGH DROPS   USE SUGARLESS CANDY INSTEAD (Jolley ranchers or Stover's or Life Savers) or even ice chips will also do - the key is to swallow to prevent all throat clearing. NO OIL BASED VITAMINS - use powdered substitutes.     Please schedule a follow up office visit in 4 weeks, sooner if needed with PFTs on return

## 2018-04-17 NOTE — Progress Notes (Signed)
Subjective:     Patient ID: Kirk Anderson, male   DOB: [birth year]   MRN: [id number]     Brief patient profile:  72   yowm quit smoking 1992 with COPD GOD III criteria 09/2011    History of Present Illness  June 29, 2009 ov with pft's no improvment doe and very hoarse, lots of throat clearing, no excess mucus imp was upper airway instability ? advair irriating  rec Stop advair  Start Symbicort 160 2 puffs first thing in am and 2 puffs again in pm about 12 hours later      08/26/2011 f/u ov/Kyrielle Urbanski cc doe x large aisle like [employer],  freq use of combivent when humid. rec Start spiriva one capsule each am You should see gradual improvement in your breathing and much less need for combivent     06/16/2016  f/u ov/Marcela Alatorre re: GOLD III/ symb 160/spiriva p worse p 1 week or two of Bevespi  Chief Complaint  Patient presents with  . Follow-up    Breathing is doing well today. He notices occ wheezing "mucinex clears it up". He has not used albuterol in 2 months.   doe = MMRC2 = can't walk a nl pace on a flat grade s sob but does fine slow and flat   rec No change in medications = spiriva/ symbicort  Only use your albuterol as a rescue medication     04/17/2018  f/u ov/Jalynne Persico re:   Re-establish re GOLD III  maint on symb 160/spiriva smi Chief Complaint  Patient presents with  . Follow-up    SOB with activity, concrens about spirvia makes him cough right afer taking it.   Dyspnea: toting wood, gets in a hurry: MMRC2 = can't walk a nl pace on a flat grade s sob but does fine slow and flat    Cough: none unless assoc with choking cessation p taking spiriva  Which he uses before symb Sleep: fine  SABA use:  Up to sev times daily when over do it  Likes to chew tobacco     No obvious day to day or daytime variability or assoc excess/ purulent sputum or mucus plugs or hemoptysis or cp or chest tightness, subjective wheeze or overt sinus or hb symptoms. No unusual exposure hx or h/o childhood pna/  asthma or knowledge of premature birth.  Sleeping  Fine flat   without nocturnal  or early am exacerbation  of respiratory  c/o's or need for noct saba. Also denies any obvious fluctuation of symptoms with weather or environmental changes or other aggravating or alleviating factors except as outlined above   Current Allergies, Complete Past Medical History, Past Surgical History, Family History, and Social History were reviewed in [employer] record.  ROS  The following are not active complaints unless bolded Hoarseness, sore throat, dysphagia, dental problems, itching, sneezing,  nasal congestion or discharge of excess mucus or purulent secretions, ear ache,   fever, chills, sweats, unintended wt loss or wt gain, classically pleuritic or exertional cp,  orthopnea pnd or arm/hand swelling  or leg swelling, presyncope, palpitations, abdominal pain, anorexia, nausea, vomiting, diarrhea  or change in bowel habits or change in bladder habits, change in stools or change in urine, dysuria, hematuria,  rash, arthralgias, visual complaints, headache, numbness, weakness or ataxia or problems with walking or coordination,  change in mood or  memory.        Current Meds  Medication Sig  . aspirin 81 MG tablet Take  81 mg by mouth daily.    . Cetirizine HCl (ZYRTEC ALLERGY PO) Take by mouth every morning.  Marland Kitchen dextromethorphan-guaiFENesin (MUCINEX DM) 30-600 MG per 12 hr tablet Take 1 tablet by mouth 2 (two) times daily as needed for cough.  . Multiple Vitamin (MULTIVITAMIN) tablet Take 1 tablet by mouth daily.    . naproxen sodium (ANAPROX) 220 MG tablet Take 440 mg by mouth 2 (two) times daily as needed (for pain).   Marland Kitchen PROAIR HFA 108 (90 BASE) MCG/ACT inhaler INHALE 2 PUFFS INTO THE LUNGS EVERY 6 (SIX) HOURS AS NEEDED FOR WHEEZING OR SHORTNESS OF BREATH.  . rosuvastatin (CRESTOR) 10 MG tablet Take 1 tablet (10 mg total) by mouth daily.  Marland Kitchen SPIRIVA RESPIMAT 2.5 MCG/ACT AERS USE 2  INHALATIONS EVERY DAY AS DIRECTED  . SYMBICORT 160-4.5 MCG/ACT inhaler INHALE 2 PUFFS INTO THE LUNGS 2 (TWO) TIMES DAILY.  Marland Kitchen  ] omeprazole (PRILOSEC) 20 MG capsule Take 20 mg by mouth daily before breakfast.            Past Medical History:  EMPHYSEMA (ICD-492.8)  CHRONIC OBSTRUCTIVE PULMONARY DISEASE, MODERATE (ICD-496)  GERD  Coronary artery disease - minor by cath 2000  Allergic rhinitis  Multiple rib fxs 3/20011          Objective:   Physical Exam  amb hoarse wm very loud talker   wt 194 April 16, 2009 > 188 June 29, 2009 >   183 08/26/2011 > 10/07/2011  183 > 04/11/2012  180 > 178 09/04/2015 > 10/02/2015  182 >  01/04/2016 183 > 03/16/2016  180 > 06/16/2016   186  > 04/17/2018    180    Vital signs reviewed - Note on arrival 02 sats  91% on RA      HEENT: nl dentition / oropharynx. Nl external ear canals without cough reflex - moderate bilateral non-specific turbinate edema     NECK :  without JVD/Nodes/TM/ nl carotid upstrokes bilaterally   LUNGS: no acc muscle use,  Mod barrel  contour chest wall with bilateral  Distant bs s audible wheeze and  without cough on insp or exp maneuver and mod   Hyperresonant  to  percussion bilaterally     CV:  RRR  no s3 or murmur or increase in P2, and no edema   ABD:  soft and nontender with pos mid insp Hoover's  in the supine position. No bruits or organomegaly appreciated, bowel sounds nl  MS:   Nl gait/  ext warm without deformities, calf tenderness, cyanosis or clubbing No obvious joint restrictions   SKIN: warm and dry without lesions    NEURO:  alert, approp, nl sensorium with  no motor or cerebellar deficits apparent.                 Assessment:

## 2018-04-18 ENCOUNTER — Encounter: Payer: Self-pay | Admitting: Internal Medicine

## 2018-04-18 DIAGNOSIS — R058 Other specified cough: Secondary | ICD-10-CM | POA: Insufficient documentation

## 2018-04-18 DIAGNOSIS — R05 Cough: Secondary | ICD-10-CM | POA: Insufficient documentation

## 2018-04-18 NOTE — Assessment & Plan Note (Addendum)
Quit smoking 1992 - PFT's 03/24/05   FEV1 1.37 ratio 42, 21% improvment in FVC after B2, DLC0 80  - PFT's 06/29/09 FEV1 1.19 ratio 29,   6 % better after B2, DLC0 73  - PFT's 10/07/2011  1.33 (43%) ratio 30 no better DLCO 82% so GOLD III  - Alpha one genotype  10/07/2011 >    MM -03/16/2016  extensive coaching HFA effectiveness =  90% > trial of bevespi > worse so changed back to symb/ spiriva respimat - 03/16/2016   Walked RA  2 laps @ 185 ft each stopped due to sob with sats 88%    Pt is Group B in terms of symptom/risk and laba/lama therefore appropriate rx at this point but he's convinced the lama in spiriva is making him cough but more likely this is upper airway cough syndrome from gerd (see separate a/p)   For now just asked him to time the symb first, then the spiriva smi, then rinse and gargle immediately after while on reflux rx x 4 weeks then return for pfts and regroup then ? Change to bivespi (dpi's not a good choice in pt with cough)   Extra time with pt today on following issues  - The proper method of use, as well as anticipated side effects, of a metered-dose inhaler are discussed and demonstrated to the patient.   Each maintenance medication was reviewed in detail including most importantly the difference between maintenance and as needed and under what circumstances the prns are to be used.  Please see AVS for specific  Instructions which are unique to this visit and I personally typed out  which were reviewed in detail in writing with the patient and a copy provided.    > 50% of visit spent face to face counseling/ training

## 2018-04-18 NOTE — Assessment & Plan Note (Signed)
Upper airway cough syndrome (previously labeled PNDS),  is so named because it's frequently impossible to sort out how much is  CR/sinusitis with freq throat clearing (which can be related to primary GERD)   vs  causing  secondary (" extra esophageal")  GERD from wide swings in gastric pressure that occur with throat clearing, often  promoting self use of mint and menthol lozenges that reduce the lower esophageal sphincter tone and exacerbate the problem further in a cyclical fashion.   These are the same pts (now being labeled as having "irritable larynx syndrome" by some cough centers) who not infrequently have a history of having failed to tolerate ace inhibitors,  dry powder inhalers or biphosphonates or report having atypical/extraesophageal reflux symptoms that don't respond to standard doses of PPI  and are easily confused as having aecopd or asthma flares by even experienced allergists/ pulmonologists (myself included).    rec max rx for gerd/ f/u in 4 weeks

## 2018-05-11 DIAGNOSIS — H2513 Age-related nuclear cataract, bilateral: Secondary | ICD-10-CM | POA: Diagnosis not present

## 2018-05-18 ENCOUNTER — Ambulatory Visit: Payer: Medicare Other | Admitting: Internal Medicine

## 2018-05-22 ENCOUNTER — Ambulatory Visit (INDEPENDENT_AMBULATORY_CARE_PROVIDER_SITE_OTHER): Payer: Medicare Other | Admitting: Internal Medicine

## 2018-05-22 ENCOUNTER — Encounter: Payer: Self-pay | Admitting: Internal Medicine

## 2018-05-22 VITALS — BP 144/88 | HR 100 | Temp 97.9°F | Ht 70.0 in | Wt 173.0 lb

## 2018-05-22 DIAGNOSIS — I10 Essential (primary) hypertension: Secondary | ICD-10-CM

## 2018-05-22 DIAGNOSIS — J449 Chronic obstructive pulmonary disease, unspecified: Secondary | ICD-10-CM | POA: Diagnosis not present

## 2018-05-22 DIAGNOSIS — J018 Other acute sinusitis: Secondary | ICD-10-CM | POA: Diagnosis not present

## 2018-05-22 DIAGNOSIS — J309 Allergic rhinitis, unspecified: Secondary | ICD-10-CM | POA: Diagnosis not present

## 2018-05-22 MED ORDER — LEVOFLOXACIN 500 MG PO TABS: 500.0000 mg | ORAL_TABLET | Freq: Every day | ORAL | 0 refills | Status: AC

## 2018-05-22 MED ORDER — HYDROCODONE-HOMATROPINE 5-1.5 MG/5ML PO SYRP: 5.0000 mL | ORAL_SOLUTION | Freq: Four times a day (QID) | ORAL | 0 refills | Status: AC | PRN

## 2018-05-22 MED ORDER — TRIAMCINOLONE ACETONIDE 55 MCG/ACT NA AERO: 2.0000 | INHALATION_SPRAY | Freq: Every day | NASAL | 12 refills | Status: DC

## 2018-05-22 NOTE — Patient Instructions (Addendum)
Please take all new medication as prescribed - the antibiotic, and cough medicine if needed  You can also take Mucinex (or it's generic off brand) for congestion, and tylenol as needed for pain.  Please continue all other medications as before, including the nasacort  Please have the pharmacy call with any other refills you may need.  Please continue your efforts at being more active, low cholesterol diet, and weight control.  Please keep your appointments with your specialists as you may have planned

## 2018-05-22 NOTE — Assessment & Plan Note (Signed)
Mild to mod, for antibx course,  to f/u any worsening symptoms or concerns 

## 2018-05-22 NOTE — Assessment & Plan Note (Signed)
Ok for nasacort asd,  to f/u any worsening symptoms or concerns 

## 2018-05-22 NOTE — Assessment & Plan Note (Signed)
stable overall by history and exam, and pt to continue medical treatment as before,  to f/u any worsening symptoms or concerns 

## 2018-05-22 NOTE — Assessment & Plan Note (Signed)
Mild elevated, likely reactive, o/w stable overall by history and exam, recent data reviewed with pt, and pt to continue medical treatment as before,  to f/u any worsening symptoms or concerns BP Readings from Last 3 Encounters:  05/22/18 (!) 144/88  04/17/18 122/82  03/07/18 122/78

## 2018-05-22 NOTE — Progress Notes (Signed)
Subjective:    Patient ID: Kirk Anderson, male    DOB: 10/15/1946, 72 y.o.   MRN: [id number]  HPI   Here with 7 days acute onset fever, facial pain, pressure, marked congestion with post nasal gtt with cough headache, general weakness and malaise, and greenish d/c, with mild ST,, but pt denies chest pain, wheezing, increased sob or doe, orthopnea, PND, increased LE swelling, palpitations, dizziness or syncope.  Does have several wks ongoing nasal allergy symptoms with clearish congestion, itch and sneezing, without fever, pain, ST, cough, swelling or wheezing.  Pt denies new neurological symptoms such as new headache, or facial or extremity weakness or numbness   Pt denies polydipsia, polyuria Past Medical History:  Diagnosis Date  . ALLERGIC RHINITIS 05/26/2008  . COPD (chronic obstructive pulmonary disease) (HCC)    PFT's 03/24/05 FEV1 1.37 ratio 42, 21% improvement in FVC after B2, DLCO 80 -PFT's 06/29/09 1.19 ratio 29 6% better after B2, DLC0 73 -HFA 75% 06/29/2009 >90% 08/10/2009  . CORONARY ARTERY DISEASE 05/26/2008   minor by cath 2000  . EMPHYSEMA 10/29/2007  . GERD 05/26/2008  . Hyperlipidemia   . Impaired glucose tolerance 04/08/2014  . Multiple rib fractures 02/2000   Past Surgical History:  Procedure Laterality Date  . APPENDECTOMY    . HEMORRHOID SURGERY      reports that he quit smoking about 27 years ago. His smoking use included cigarettes. He has a 60.00 pack-year smoking history. His smokeless tobacco use includes chew. He reports that he does not drink alcohol or use drugs. family history includes Alcohol abuse in his brother; Aneurysm in his mother; Heart disease in his father. Allergies  Allergen Reactions  . Statins Other (See Comments)    weakness  . Sudafed [Pseudoephedrine Hcl] Other (See Comments)    Urinary retention   Current Outpatient Medications on File Prior to Visit  Medication Sig Dispense Refill  . aspirin 81 MG tablet Take 81 mg by mouth daily.       . Cetirizine HCl (ZYRTEC ALLERGY PO) Take by mouth every morning.    Marland Kitchen. dextromethorphan-guaiFENesin (MUCINEX DM) 30-600 MG per 12 hr tablet Take 1 tablet by mouth 2 (two) times daily as needed for cough.    . famotidine (PEPCID) 20 MG tablet One at bedtime 30 tablet 11  . Multiple Vitamin (MULTIVITAMIN) tablet Take 1 tablet by mouth daily.      . naproxen sodium (ANAPROX) 220 MG tablet Take 440 mg by mouth 2 (two) times daily as needed (for pain).     . pantoprazole (PROTONIX) 40 MG tablet Take 1 tablet (40 mg total) by mouth daily. Take 30-60 min before first meal of the day 30 tablet 2  . PROAIR HFA 108 (90 BASE) MCG/ACT inhaler INHALE 2 PUFFS INTO THE LUNGS EVERY 6 (SIX) HOURS AS NEEDED FOR WHEEZING OR SHORTNESS OF BREATH. 8.5 g 11  . rosuvastatin (CRESTOR) 10 MG tablet Take 1 tablet (10 mg total) by mouth daily. 90 tablet 3  . SPIRIVA RESPIMAT 2.5 MCG/ACT AERS USE 2 INHALATIONS EVERY DAY AS DIRECTED 4 g 11  . SYMBICORT 160-4.5 MCG/ACT inhaler INHALE 2 PUFFS INTO THE LUNGS 2 (TWO) TIMES DAILY. 10.2 g 3   No current facility-administered medications on file prior to visit.    Review of Systems  Constitutional: Negative for other unusual diaphoresis or sweats HENT: Negative for ear discharge or swelling Eyes: Negative for other worsening visual disturbances Respiratory: Negative for stridor or other swelling  Gastrointestinal:  Negative for worsening distension or other blood Genitourinary: Negative for retention or other urinary change Musculoskeletal: Negative for other MSK pain or swelling Skin: Negative for color change or other new lesions Neurological: Negative for worsening tremors and other numbness  Psychiatric/Behavioral: Negative for worsening agitation or other fatigue All other system neg per pt    Objective:   Physical Exam BP (!) 144/88   Pulse 100   Temp 97.9 F (36.6 C) (Oral)   Ht 5\' 10"  (1.778 m)   Wt 173 lb (78.5 kg)   SpO2 93%   BMI 24.82 kg/m  VS noted,  mild ill Constitutional: Pt appears in NAD HENT: Head: NCAT.  Right Ear: External ear normal.  Left Ear: External ear normal.  Bilat tm's with mild erythema.  Max sinus areas mild tender.  Pharynx with mild erythema, no exudate Eyes: . Pupils are equal, round, and reactive to light. Conjunctivae and EOM are normal Nose: without d/c or deformity Neck: Neck supple. Gross normal ROM Cardiovascular: Normal rate and regular rhythm.   Pulmonary/Chest: Effort normal and breath sounds without rales or wheezing.  Neurological: Pt is alert. At baseline orientation, motor grossly intact Skin: Skin is warm. No rashes, other new lesions, no LE edema Psychiatric: Pt behavior is normal without agitation  No other exam findings       Assessment & Plan:

## 2018-05-23 ENCOUNTER — Ambulatory Visit: Payer: Medicare Other | Admitting: Internal Medicine

## 2018-06-08 ENCOUNTER — Ambulatory Visit: Payer: Medicare Other | Admitting: Internal Medicine

## 2018-06-19 ENCOUNTER — Other Ambulatory Visit: Payer: Self-pay | Admitting: Internal Medicine

## 2018-08-27 NOTE — Progress Notes (Addendum)
Subjective:   Kirk Anderson is a 72 y.o. male who presents for Medicare Annual/Subsequent preventive examination.  Review of Systems:  No ROS.  Medicare Wellness Visit. Additional risk factors are reflected in the social history.  Cardiac Risk Factors include: advanced age (>51men, >87 women);dyslipidemia;hypertension;male gender Sleep patterns: feels rested on waking, gets up 1-2 times nightly to void and sleeps 7-8 hours nightly.    Home Safety/Smoke Alarms: Feels safe in home. Smoke alarms in place.  Living environment; residence and Firearm Safety: 1-story house/ trailer, no firearms. Lives with wife, no needs for DME, good support system. Seat Belt Safety/Bike Helmet: Wears seat belt.   PSA-  Lab Results  Component Value Date   PSA 2.12 03/07/2018   PSA 1.76 03/03/2017   PSA 1.88 03/31/2015       Objective:    Vitals: BP (!) 144/82   Pulse 84   Resp 18   Ht 5\' 10"  (1.778 m)   Wt 181 lb (82.1 kg)   SpO2 97%   BMI 25.97 kg/m   Body mass index is 25.97 kg/m.  Advanced Directives 08/28/2018 08/22/2017 01/04/2016 12/25/2013  Does Patient Have a Medical Advance Directive? Yes Yes Yes Patient has advance directive, copy not in chart  Type of Advance Directive Healthcare Power of [place];Living will Healthcare Power of [employer];Living will - Living will  Copy of Healthcare Power of Attorney in Chart? No - copy requested No - copy requested - Copy requested from family  Pre-existing out of facility DNR order (yellow form or pink MOST form) - - - No    Tobacco Social History   Tobacco Use  Smoking Status Former Smoker  . Packs/day: 2.00  . Years: 30.00  . Pack years: 60.00  . Types: Cigarettes  . Last attempt to quit: 12/19/1990  . Years since quitting: 27.7  Smokeless Tobacco Current User  . Types: Chew     Ready to quit: Not Answered Counseling given: Not Answered  Past Medical History:  Diagnosis Date  . ALLERGIC RHINITIS 05/26/2008  . COPD (chronic  obstructive pulmonary disease) (HCC)    PFT's 03/24/05 FEV1 1.37 ratio 42, 21% improvement in FVC after B2, DLCO 80 -PFT's 06/29/09 1.19 ratio 29 6% better after B2, DLC0 73 -HFA 75% 06/29/2009 >90% 08/10/2009  . CORONARY ARTERY DISEASE 05/26/2008   minor by cath 2000  . EMPHYSEMA 10/29/2007  . GERD 05/26/2008  . Hyperlipidemia   . Impaired glucose tolerance 04/08/2014  . Multiple rib fractures 02/2000   Past Surgical History:  Procedure Laterality Date  . APPENDECTOMY    . HEMORRHOID SURGERY     Family History  Problem Relation Age of Onset  . Aneurysm Mother        Vacular Aneurysm  . Heart disease Father        CAD/Father had MI in his 57's  . Alcohol abuse Brother   . Colon cancer Neg Hx    Social History   Socioeconomic History  . Marital status: Married    Spouse name: Not on file  . Number of children: 2  . Years of education: Not on file  . Highest education level: Not on file  Occupational History  . Not on file  Social Needs  . Financial resource strain: Not hard at all  . Food insecurity:    Worry: Never true    Inability: Never true  . Transportation needs:    Medical: No    Non-medical: No  Tobacco Use  .  Smoking status: Former Smoker    Packs/day: 2.00    Years: 30.00    Pack years: 60.00    Types: Cigarettes    Last attempt to quit: 12/19/1990    Years since quitting: 27.7  . Smokeless tobacco: Current User    Types: Chew  Substance and Sexual Activity  . 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 Drug use: No  . Sexual activity: Yes  Lifestyle  . Physical activity:    Days per week: 0 days    Minutes per session: 0 min  . Stress: Not at all  Relationships  . Social connections:    Talks on phone: More than three times a week    Gets together: More than three times a week    Attends religious service: Never    Active member of club or organization: Not on file    Attends  meetings of clubs or organizations: More than 4 times per year    Relationship status: Married  Other Topics Concern  . Not on file  Social History Narrative   Former Scientist, research (life sciences)   2 daughters    Outpatient Encounter Medications as of 08/28/2018  Medication Sig  . aspirin 81 MG tablet Take 81 mg by mouth daily.    . Cetirizine HCl (ZYRTEC ALLERGY PO) Take by mouth every morning.  Marland Kitchen dextromethorphan-guaiFENesin (MUCINEX DM) 30-600 MG per 12 hr tablet Take 1 tablet by mouth 2 (two) times daily as needed for cough.  . famotidine (PEPCID) 20 MG tablet One at bedtime  . Multiple Vitamin (MULTIVITAMIN) tablet Take 1 tablet by mouth daily.    . naproxen sodium (ANAPROX) 220 MG tablet Take 440 mg by mouth 2 (two) times daily as needed (for pain).   . pantoprazole (PROTONIX) 40 MG tablet Take 1 tablet (40 mg total) by mouth daily. Take 30-60 min before first meal of the day  . PROAIR HFA 108 (90 BASE) MCG/ACT inhaler INHALE 2 PUFFS INTO THE LUNGS EVERY 6 (SIX) HOURS AS NEEDED FOR WHEEZING OR SHORTNESS OF BREATH.  Marland Kitchen SPIRIVA RESPIMAT 2.5 MCG/ACT AERS USE 2 INHALATIONS EVERY DAY AS DIRECTED  . SYMBICORT 160-4.5 MCG/ACT inhaler INHALE 2 PUFFS INTO THE LUNGS 2 (TWO) TIMES DAILY.  Marland Kitchen triamcinolone (NASACORT) 55 MCG/ACT AERO nasal inhaler Place 2 sprays into the nose daily.  . [DISCONTINUED] rosuvastatin (CRESTOR) 10 MG tablet Take 1 tablet (10 mg total) by mouth daily. (Patient not taking: Reported on 08/28/2018)   No facility-administered encounter medications on file as of 08/28/2018.     Activities of Daily Living In your present state of health, do you have any difficulty performing the following activities: 08/28/2018  Hearing? N  Vision? N  Difficulty concentrating or making decisions? N  Walking or climbing stairs? N  Dressing or bathing? N  Doing errands, shopping? N  Preparing Food and eating ? N  Using the Toilet? N  In the past six months, have you accidently leaked urine? N   Do you have problems with loss of bowel control? N  Managing your Medications? N  Managing your Finances? N  Housekeeping or managing your Housekeeping? N  Some recent data might be hidden    Patient Care Team: Corwin Levins, MD as PCP - General Nyoka Cowden, MD as Consulting Physician (Pulmonary Disease)   Assessment:   This is a routine wellness examination for [place]. Physical assessment deferred to  PCP.   Exercise Activities and Dietary recommendations Current Exercise Habits: The patient has a physically strenous job, but has no regular exercise apart from work., Exercise limited by: None identified  Diet (meal preparation, eat out, water intake, caffeinated beverages, dairy products, fruits and vegetables): in general, a "healthy" diet  , well balanced   Reviewed heart healthy diet. Encouraged patient to increase daily water and healthy fluid intake.  Goals      Patient Stated   . patient (pt-stated)     Continue to active and fup with pulmonary      Other   . <enter goal here>    . maintain current health status     Stay active, eat healthy, enjoy life, family and church    . Patient Stated     Stay as healthy and as independent as possible as possible.        Fall Risk Fall Risk  08/28/2018 03/07/2018 08/22/2017 03/03/2017 01/04/2016  Falls in the past year? No No No No No    Depression Screen PHQ 2/9 Scores 08/28/2018 03/07/2018 08/22/2017 03/03/2017  PHQ - 2 Score 0 0 0 0  PHQ- 9 Score - - 0 -    Cognitive Function MMSE - Mini Mental State Exam 08/22/2017  Orientation to time 5  Orientation to Place 5  Registration 3  Attention/ Calculation 5  Recall 2  Language- name 2 objects 2  Language- repeat 1  Language- follow 3 step command 3  Language- read & follow direction 1  Write a sentence 1  Copy design 1  Total score 29       Ad8 score reviewed for issues:  Issues making decisions: no  Less interest in hobbies / activities: no  Repeats  questions, stories (family complaining): no  Trouble using ordinary gadgets (microwave, computer, phone):no  Forgets the month or year: no  Mismanaging finances: no  Remembering appts: no  Daily problems with thinking and/or memory: no Ad8 score is= 0  Immunization History  Administered Date(s) Administered  . Pneumococcal Conjugate-13 01/02/2014  . Pneumococcal Polysaccharide-23 02/12/2013  . Td 12/19/1993  . Tdap 02/12/2013   Screening Tests Health Maintenance  Topic Date Due  . INFLUENZA VACCINE  04/17/2019 (Originally 07/19/2018)  . TETANUS/TDAP  02/12/2023  . COLONOSCOPY  04/12/2023  . Hepatitis C Screening  Completed  . PNA vac Low Risk Adult  Completed      Plan:     Continue doing brain stimulating activities (puzzles, reading, adult coloring books, staying active) to keep memory sharp.   Continue to eat heart healthy diet (full of fruits, vegetables, whole grains, lean protein, water--limit salt, fat, and sugar intake) and increase physical activity as tolerated.  I have personally reviewed and noted the following in the patient's chart:   . Medical and social history . Use of alcohol, tobacco or illicit drugs  . Current medications and supplements . Functional ability and status . Nutritional status . Physical activity . Advanced directives . List of other physicians . Vitals . Screenings to include cognitive, depression, and falls . Referrals and appointments  In addition, I have reviewed and discussed with patient certain preventive protocols, quality metrics, and best practice recommendations. A written personalized care plan for preventive services as well as general preventive health recommendations were provided to patient.     Wanda Plump, RN  08/28/2018  Medical screening examination/treatment/procedure(s) were performed by non-physician practitioner and as supervising physician I was immediately available for consultation/collaboration. I  agree with above. Oliver Barre, MD

## 2018-08-28 ENCOUNTER — Ambulatory Visit (INDEPENDENT_AMBULATORY_CARE_PROVIDER_SITE_OTHER): Payer: Medicare Other | Admitting: *Deleted

## 2018-08-28 VITALS — BP 144/82 | HR 84 | Resp 18 | Ht 70.0 in | Wt 181.0 lb

## 2018-08-28 DIAGNOSIS — Z0000 Encounter for general adult medical examination without abnormal findings: Secondary | ICD-10-CM

## 2018-08-28 NOTE — Patient Instructions (Addendum)
Continue doing brain stimulating activities (puzzles, reading, adult coloring books, staying active) to keep memory sharp.   Continue to eat heart healthy diet (full of fruits, vegetables, whole grains, lean protein, water--limit salt, fat, and sugar intake) and increase physical activity as tolerated.   Kirk Anderson , Thank you for taking time to come for your Medicare Wellness Visit. I appreciate your ongoing commitment to your health goals. Please review the following plan we discussed and let me know if I can assist you in the future.   These are the goals we discussed: Goals      Patient Stated   . patient (pt-stated)     Continue to active and fup with pulmonary      Other   . <enter goal here>    . maintain current health status     Stay active, eat healthy, enjoy life, family and church    . Patient Stated     Stay as healthy and as independent as possible.        This is a list of the screening recommended for you and due dates:  Health Maintenance  Topic Date Due  . Flu Shot  04/17/2019*  . Tetanus Vaccine  02/12/2023  . Colon Cancer Screening  04/12/2023  .  Hepatitis C: One time screening is recommended by Center for Disease Control  (CDC) for  adults born from [birth year] through 1965.   Completed  . Pneumonia vaccines  Completed  *Topic was postponed. The date shown is not the original due date.     Health Maintenance, Male A healthy lifestyle and preventive care is important for your health and wellness. Ask your health care provider about what schedule of regular examinations is right for you. What should I know about weight and diet? Eat a Healthy Diet  Eat plenty of vegetables, fruits, whole grains, low-fat dairy products, and lean protein.  Do not eat a lot of foods high in solid fats, added sugars, or salt.  Maintain a Healthy Weight Regular exercise can help you achieve or maintain a healthy weight. You should:  Do at least 150 minutes of exercise each  week. The exercise should increase your heart rate and make you sweat (moderate-intensity exercise).  Do strength-training exercises at least twice a week.  Watch Your Levels of Cholesterol and Blood Lipids  Have your blood tested for lipids and cholesterol every 5 years starting at 72 years of age. If you are at high risk for heart disease, you should start having your blood tested when you are 73 years old. You may need to have your cholesterol levels checked more often if: ? Your lipid or cholesterol levels are high. ? You are older than 72 years of age. ? You are at high risk for heart disease.  What should I know about cancer screening? Many types of cancers can be detected early and may often be prevented. Lung Cancer  You should be screened every year for lung cancer if: ? You are a current smoker who has smoked for at least 30 years. ? You are a former smoker who has quit within the past 15 years.  Talk to your health care provider about your screening options, when you should start screening, and how often you should be screened.  Colorectal Cancer  Routine colorectal cancer screening usually begins at 72 years of age and should be repeated every 5-10 years until you are 72 years old. You may need to be screened more  often if early forms of precancerous polyps or small growths are found. Your health care provider may recommend screening at an earlier age if you have risk factors for colon cancer.  Your health care provider may recommend using home test kits to check for hidden blood in the stool.  A small camera at the end of a tube can be used to examine your colon (sigmoidoscopy or colonoscopy). This checks for the earliest forms of colorectal cancer.  Prostate and Testicular Cancer  Depending on your age and overall health, your health care provider may do certain tests to screen for prostate and testicular cancer.  Talk to your health care provider about any symptoms or  concerns you have about testicular or prostate cancer.  Skin Cancer  Check your skin from head to toe regularly.  Tell your health care provider about any new moles or changes in moles, especially if: ? There is a change in a mole's size, shape, or color. ? You have a mole that is larger than a pencil eraser.  Always use sunscreen. Apply sunscreen liberally and repeat throughout the day.  Protect yourself by wearing long sleeves, pants, a wide-brimmed hat, and sunglasses when outside.  What should I know about heart disease, diabetes, and high blood pressure?  If you are 100-60 years of age, have your blood pressure checked every 3-5 years. If you are 11 years of age or older, have your blood pressure checked every year. You should have your blood pressure measured twice-once when you are at a hospital or clinic, and once when you are not at a hospital or clinic. Record the average of the two measurements. To check your blood pressure when you are not at a hospital or clinic, you can use: ? An automated blood pressure machine at a pharmacy. ? A home blood pressure monitor.  Talk to your health care provider about your target blood pressure.  If you are between 73-68 years old, ask your health care provider if you should take aspirin to prevent heart disease.  Have regular diabetes screenings by checking your fasting blood sugar level. ? If you are at a normal weight and have a low risk for diabetes, have this test once every three years after the age of 40. ? If you are overweight and have a high risk for diabetes, consider being tested at a younger age or more often.  A one-time screening for abdominal aortic aneurysm (AAA) by ultrasound is recommended for men aged 65-75 years who are current or former smokers. What should I know about preventing infection? Hepatitis B If you have a higher risk for hepatitis B, you should be screened for this virus. Talk with your health care provider  to find out if you are at risk for hepatitis B infection. Hepatitis C Blood testing is recommended for:  Everyone born from [birth year] through 1965.  Anyone with known risk factors for hepatitis C.  Sexually Transmitted Diseases (STDs)  You should be screened each year for STDs including gonorrhea and chlamydia if: ? You are sexually active and are younger than 72 years of age. ? You are older than 72 years of age and your health care provider tells you that you are at risk for this type of infection. ? Your sexual activity has changed since you were last screened and you are at an increased risk for chlamydia or gonorrhea. Ask your health care provider if you are at risk.  Talk with your health care provider  about whether you are at high risk of being infected with HIV. Your health care provider may recommend a prescription medicine to help prevent HIV infection.  What else can I do?  Schedule regular health, dental, and eye exams.  Stay current with your vaccines (immunizations).  Do not use any tobacco products, such as cigarettes, chewing tobacco, and e-cigarettes. If you need help quitting, ask your health care provider.  Limit alcohol intake to no more than 2 drinks per day. One drink equals 12 ounces of beer, 5 ounces of Jamorris Ndiaye, or 1 ounces of hard liquor.  Do not use street drugs.  Do not share needles.  Ask your health care provider for help if you need support or information about quitting drugs.  Tell your health care provider if you often feel depressed.  Tell your health care provider if you have ever been abused or do not feel safe at home. This information is not intended to replace advice given to you by your health care provider. Make sure you discuss any questions you have with your health care provider. Document Released: 06/02/2008 Document Revised: 08/03/2016 Document Reviewed: 09/08/2015 Elsevier Interactive Patient Education  [employer].

## 2018-12-03 ENCOUNTER — Other Ambulatory Visit: Payer: Self-pay | Admitting: Internal Medicine

## 2019-02-01 ENCOUNTER — Other Ambulatory Visit: Payer: Self-pay | Admitting: Internal Medicine

## 2019-02-01 MED ORDER — ALBUTEROL SULFATE HFA 108 (90 BASE) MCG/ACT IN AERS: 2.0000 | INHALATION_SPRAY | Freq: Four times a day (QID) | RESPIRATORY_TRACT | 0 refills | Status: DC | PRN

## 2019-02-01 NOTE — Addendum Note (Signed)
Addended by: Deatra James on: 02/01/2019 03:56 PM   Modules accepted: Orders

## 2019-02-12 ENCOUNTER — Ambulatory Visit (INDEPENDENT_AMBULATORY_CARE_PROVIDER_SITE_OTHER): Payer: Medicare Other | Admitting: Internal Medicine

## 2019-02-12 ENCOUNTER — Encounter: Payer: Self-pay | Admitting: Internal Medicine

## 2019-02-12 ENCOUNTER — Other Ambulatory Visit (INDEPENDENT_AMBULATORY_CARE_PROVIDER_SITE_OTHER): Payer: Medicare Other

## 2019-02-12 VITALS — BP 124/86 | HR 90 | Temp 98.0°F | Ht 70.0 in | Wt 187.0 lb

## 2019-02-12 DIAGNOSIS — Z0000 Encounter for general adult medical examination without abnormal findings: Secondary | ICD-10-CM | POA: Diagnosis not present

## 2019-02-12 DIAGNOSIS — Z0001 Encounter for general adult medical examination with abnormal findings: Secondary | ICD-10-CM

## 2019-02-12 DIAGNOSIS — R7302 Impaired glucose tolerance (oral): Secondary | ICD-10-CM

## 2019-02-12 DIAGNOSIS — J449 Chronic obstructive pulmonary disease, unspecified: Secondary | ICD-10-CM | POA: Diagnosis not present

## 2019-02-12 DIAGNOSIS — L309 Dermatitis, unspecified: Secondary | ICD-10-CM | POA: Diagnosis not present

## 2019-02-12 LAB — PSA: PSA: 2.44 ng/mL (ref 0.10–4.00)

## 2019-02-12 LAB — CBC WITH DIFFERENTIAL/PLATELET
Basophils Absolute: 0.1 10*3/uL (ref 0.0–0.1)
Basophils Relative: 0.8 % (ref 0.0–3.0)
Eosinophils Absolute: 0.3 10*3/uL (ref 0.0–0.7)
Eosinophils Relative: 3.3 % (ref 0.0–5.0)
HCT: 46.9 % (ref 39.0–52.0)
Hemoglobin: 15.8 g/dL (ref 13.0–17.0)
LYMPHS ABS: 3.3 10*3/uL (ref 0.7–4.0)
Lymphocytes Relative: 34.3 % (ref 12.0–46.0)
MCHC: 33.7 g/dL (ref 30.0–36.0)
MCV: 89.5 fl (ref 78.0–100.0)
Monocytes Absolute: 0.7 10*3/uL (ref 0.1–1.0)
Monocytes Relative: 7.3 % (ref 3.0–12.0)
Neutro Abs: 5.2 10*3/uL (ref 1.4–7.7)
Neutrophils Relative %: 54.3 % (ref 43.0–77.0)
Platelets: 272 10*3/uL (ref 150.0–400.0)
RBC: 5.24 Mil/uL (ref 4.22–5.81)
RDW: 13.2 % (ref 11.5–15.5)
WBC: 9.6 10*3/uL (ref 4.0–10.5)

## 2019-02-12 LAB — HEMOGLOBIN A1C: Hgb A1c MFr Bld: 5.6 % (ref 4.6–6.5)

## 2019-02-12 LAB — URINALYSIS, ROUTINE W REFLEX MICROSCOPIC
BILIRUBIN URINE: NEGATIVE
Ketones, ur: NEGATIVE
Leukocytes,Ua: NEGATIVE
Nitrite: NEGATIVE
Specific Gravity, Urine: 1.01 (ref 1.000–1.030)
Total Protein, Urine: NEGATIVE
UROBILINOGEN UA: 0.2 (ref 0.0–1.0)
Urine Glucose: NEGATIVE
pH: 7 (ref 5.0–8.0)

## 2019-02-12 LAB — BASIC METABOLIC PANEL
BUN: 11 mg/dL (ref 6–23)
CO2: 28 meq/L (ref 19–32)
Calcium: 9.6 mg/dL (ref 8.4–10.5)
Chloride: 102 mEq/L (ref 96–112)
Creatinine, Ser: 0.88 mg/dL (ref 0.40–1.50)
GFR: 84.9 mL/min (ref >60.00)
Glucose, Bld: 93 mg/dL (ref 70–99)
Potassium: 4.3 mEq/L (ref 3.5–5.1)
Sodium: 138 mEq/L (ref 135–145)

## 2019-02-12 LAB — LIPID PANEL
Cholesterol: 224 mg/dL — ABNORMAL HIGH (ref 0–200)
HDL: 52.9 mg/dL (ref >39.00)
NONHDL: 170.92
Total CHOL/HDL Ratio: 4
Triglycerides: 208 mg/dL — ABNORMAL HIGH (ref 0.0–149.0)
VLDL: 41.6 mg/dL — ABNORMAL HIGH (ref 0.0–40.0)

## 2019-02-12 LAB — HEPATIC FUNCTION PANEL
ALT: 12 U/L (ref 0–53)
AST: 19 U/L (ref 0–37)
Albumin: 4.6 g/dL (ref 3.5–5.2)
Alkaline Phosphatase: 58 U/L (ref 39–117)
Bilirubin, Direct: 0.2 mg/dL (ref 0.0–0.3)
Total Bilirubin: 1.3 mg/dL — ABNORMAL HIGH (ref 0.2–1.2)
Total Protein: 7.4 g/dL (ref 6.0–8.3)

## 2019-02-12 LAB — LDL CHOLESTEROL, DIRECT: Direct LDL: 149 mg/dL

## 2019-02-12 LAB — TSH: TSH: 1.98 u[IU]/mL (ref 0.35–4.50)

## 2019-02-12 MED ORDER — ALBUTEROL SULFATE HFA 108 (90 BASE) MCG/ACT IN AERS: 2.0000 | INHALATION_SPRAY | Freq: Four times a day (QID) | RESPIRATORY_TRACT | 11 refills | Status: DC | PRN

## 2019-02-12 MED ORDER — TIOTROPIUM BROMIDE MONOHYDRATE 2.5 MCG/ACT IN AERS: INHALATION_SPRAY | RESPIRATORY_TRACT | 11 refills | Status: DC

## 2019-02-12 MED ORDER — TRIAMCINOLONE ACETONIDE 0.1 % EX CREA: 1.0000 "application " | TOPICAL_CREAM | Freq: Two times a day (BID) | CUTANEOUS | 1 refills | Status: DC

## 2019-02-12 MED ORDER — BUDESONIDE-FORMOTEROL FUMARATE 160-4.5 MCG/ACT IN AERO: INHALATION_SPRAY | RESPIRATORY_TRACT | 11 refills | Status: DC

## 2019-02-12 NOTE — Progress Notes (Signed)
Subjective:    Patient ID: Kirk Anderson, male    DOB: 01-Aug-1946, 73 y.o.   MRN: [id number]  HPI  Here for wellness and f/u;  Overall doing ok;  Pt denies Chest pain, worsening SOB, DOE, wheezing, orthopnea, PND, worsening LE edema, palpitations, dizziness or syncope.  Pt denies neurological change such as new headache, facial or extremity weakness.  Pt denies polydipsia, polyuria, or low sugar symptoms. Pt states overall good compliance with treatment and medications, good tolerability, and has been trying to follow appropriate diet.  Pt denies worsening depressive symptoms, suicidal ideation or panic. No fever, night sweats, wt loss, loss of appetite, or other constitutional symptoms.  Pt states good ability with ADL's, has low fall risk, home safety reviewed and adequate, no other significant changes in hearing or vision, and only occasionally active with exercise. Has rash to bilat temples with mild itching for several wks not improving.   Past Medical History:  Diagnosis Date  . ALLERGIC RHINITIS 05/26/2008  . COPD (chronic obstructive pulmonary disease) (HCC)    PFT's 03/24/05 FEV1 1.37 ratio 42, 21% improvement in FVC after B2, DLCO 80 -PFT's 06/29/09 1.19 ratio 29 6% better after B2, DLC0 73 -HFA 75% 06/29/2009 >90% 08/10/2009  . CORONARY ARTERY DISEASE 05/26/2008   minor by cath 2000  . EMPHYSEMA 10/29/2007  . GERD 05/26/2008  . Hyperlipidemia   . Impaired glucose tolerance 04/08/2014  . Multiple rib fractures 02/2000   Past Surgical History:  Procedure Laterality Date  . APPENDECTOMY    . HEMORRHOID SURGERY      reports that he quit smoking about 28 years ago. His smoking use included cigarettes. He has a 60.00 pack-year smoking history. His smokeless tobacco use includes chew. He reports that he does not drink alcohol or use drugs. family history includes Alcohol abuse in his brother; Aneurysm in his mother; Heart disease in his father. Allergies  Allergen Reactions  . Statins  Other (See Comments)    weakness  . Sudafed [Pseudoephedrine Hcl] Other (See Comments)    Urinary retention   Current Outpatient Medications on File Prior to Visit  Medication Sig Dispense Refill  . aspirin 81 MG tablet Take 81 mg by mouth daily.      . Cetirizine HCl (ZYRTEC ALLERGY PO) Take by mouth every morning.    Marland Kitchen dextromethorphan-guaiFENesin (MUCINEX DM) 30-600 MG per 12 hr tablet Take 1 tablet by mouth 2 (two) times daily as needed for cough.    . famotidine (PEPCID) 20 MG tablet One at bedtime 30 tablet 11  . Multiple Vitamin (MULTIVITAMIN) tablet Take 1 tablet by mouth daily.      . naproxen sodium (ANAPROX) 220 MG tablet Take 440 mg by mouth 2 (two) times daily as needed (for pain).     . pantoprazole (PROTONIX) 40 MG tablet Take 1 tablet (40 mg total) by mouth daily. Take 30-60 min before first meal of the day 30 tablet 2  . triamcinolone (NASACORT) 55 MCG/ACT AERO nasal inhaler Place 2 sprays into the nose daily. 1 Inhaler 12   No current facility-administered medications on file prior to visit.    Review of Systems Constitutional: Negative for other unusual diaphoresis, sweats, appetite or weight changes HENT: Negative for other worsening hearing loss, ear pain, facial swelling, mouth sores or neck stiffness.   Eyes: Negative for other worsening pain, redness or other visual disturbance.  Respiratory: Negative for other stridor or swelling Cardiovascular: Negative for other palpitations or other chest pain  Gastrointestinal: Negative for worsening diarrhea or loose stools, blood in stool, distention or other pain Genitourinary: Negative for hematuria, flank pain or other change in urine volume.  Musculoskeletal: Negative for myalgias or other joint swelling.  Skin: Negative for other color change, or other wound or worsening drainage.  Neurological: Negative for other syncope or numbness. Hematological: Negative for other adenopathy or swelling Psychiatric/Behavioral:  Negative for hallucinations, other worsening agitation, SI, self-injury, or new decreased concentration All other system neg per pt    Objective:   Physical Exam BP 124/86   Pulse 90   Temp 98 F (36.7 C) (Oral)   Ht 5\' 10"  (1.778 m)   Wt 187 lb (84.8 kg)   SpO2 90%   BMI 26.83 kg/m  VS noted,  Constitutional: Pt is oriented to person, place, and time. Appears well-developed and well-nourished, in no significant distress and comfortable Head: Normocephalic and atraumatic  Eyes: Conjunctivae and EOM are normal. Pupils are equal, round, and reactive to light Right Ear: External ear normal without discharge Left Ear: External ear normal without discharge Nose: Nose without discharge or deformity Mouth/Throat: Oropharynx is without other ulcerations and moist  Neck: Normal range of motion. Neck supple. No JVD present. No tracheal deviation present or significant neck LA or mass Cardiovascular: Normal rate, regular rhythm, normal heart sounds and intact distal pulses.   Pulmonary/Chest: WOB normal and breath sounds decreased without rales or wheezing  Abdominal: Soft. Bowel sounds are normal. NT. No HSM  Musculoskeletal: Normal range of motion. Exhibits no edema Lymphadenopathy: Has no other cervical adenopathy.  Neurological: Pt is alert and oriented to person, place, and time. Pt has normal reflexes. No cranial nerve deficit. Motor grossly intact, Gait intact Skin: Skin is warm and dry. No new ulcerations, but has bilat temple area nontender erythema rash Psychiatric:  Has normal mood and affect. Behavior is normal without agitation No other exam findings Lab Results  Component Value Date   WBC 9.6 02/12/2019   HGB 15.8 02/12/2019   HCT 46.9 02/12/2019   PLT 272.0 02/12/2019   GLUCOSE 93 02/12/2019   CHOL 224 (H) 02/12/2019   TRIG 208.0 (H) 02/12/2019   HDL 52.90 02/12/2019   LDLDIRECT 149.0 02/12/2019   LDLCALC 102 (H) 04/08/2014   ALT 12 02/12/2019   AST 19 02/12/2019    NA 138 02/12/2019   K 4.3 02/12/2019   CL 102 02/12/2019   CREATININE 0.88 02/12/2019   BUN 11 02/12/2019   CO2 28 02/12/2019   TSH 1.98 02/12/2019   PSA 2.44 02/12/2019   HGBA1C 5.6 02/12/2019       Assessment & Plan:

## 2019-02-12 NOTE — Assessment & Plan Note (Signed)
stable overall by history and exam, recent data reviewed with pt, and pt to continue medical treatment as before,  to f/u any worsening symptoms or concerns  

## 2019-02-12 NOTE — Patient Instructions (Signed)
Please take all new medication as prescribed - the cream  Please call for Dermatology referral if the skin area does not improve  Please continue all other medications as before, and refills have been done if requested - all 3 inhalers  Please have the pharmacy call with any other refills you may need.  Please continue your efforts at being more active, low cholesterol diet, and weight control.  You are otherwise up to date with prevention measures today.  Please keep your appointments with your specialists as you may have planned  Please go to the LAB in the Basement (turn left off the elevator) for the tests to be done today  You will be contacted by phone if any changes need to be made immediately.  Otherwise, you will receive a letter about your results with an explanation, but please check with MyChart first.  .Please return in 1 year for your yearly visit, or sooner if needed, with Lab testing done 3-5 days before

## 2019-02-12 NOTE — Assessment & Plan Note (Signed)
For triam cr trial, consider derm referral

## 2019-02-12 NOTE — Assessment & Plan Note (Signed)
stable overall by history and exam, recent data reviewed with pt, and pt to continue medical treatment as before,  to f/u any worsening symptoms or concerns, for a1c with labs 

## 2019-02-12 NOTE — Assessment & Plan Note (Signed)

## 2019-04-18 ENCOUNTER — Other Ambulatory Visit: Payer: Self-pay | Admitting: Internal Medicine

## 2019-04-18 ENCOUNTER — Telehealth: Payer: Self-pay | Admitting: Internal Medicine

## 2019-04-18 MED ORDER — TIOTROPIUM BROMIDE MONOHYDRATE 2.5 MCG/ACT IN AERS: INHALATION_SPRAY | RESPIRATORY_TRACT | 2 refills | Status: DC

## 2019-04-18 MED ORDER — TRIAMCINOLONE ACETONIDE 0.1 % EX CREA: 1.0000 "application " | TOPICAL_CREAM | Freq: Two times a day (BID) | CUTANEOUS | 1 refills | Status: AC

## 2019-04-18 MED ORDER — ALBUTEROL SULFATE HFA 108 (90 BASE) MCG/ACT IN AERS: 2.0000 | INHALATION_SPRAY | Freq: Four times a day (QID) | RESPIRATORY_TRACT | 2 refills | Status: AC | PRN

## 2019-04-18 MED ORDER — BUDESONIDE-FORMOTEROL FUMARATE 160-4.5 MCG/ACT IN AERO: INHALATION_SPRAY | RESPIRATORY_TRACT | 2 refills | Status: DC

## 2019-04-18 MED ORDER — TRIAMCINOLONE ACETONIDE 55 MCG/ACT NA AERO: 2.0000 | INHALATION_SPRAY | Freq: Every day | NASAL | 2 refills | Status: DC

## 2019-04-18 NOTE — Telephone Encounter (Signed)
Reviewed chart pt is up-to-date sent refills to [facility].Marland KitchenRaechel Anderson

## 2019-04-18 NOTE — Telephone Encounter (Signed)
Copied from CRM [id number]. Topic: General - Other >> Apr 18, 2019 11:33 AM Leafy Ro wrote: Reason for CRM: pt is calling and needs new rxs albuterol inhaler, symbicort, spirva and triamcinolone cream sent to his new pharm [facility].

## 2019-09-03 ENCOUNTER — Ambulatory Visit (INDEPENDENT_AMBULATORY_CARE_PROVIDER_SITE_OTHER): Payer: Medicare Other | Admitting: *Deleted

## 2019-09-03 DIAGNOSIS — Z0000 Encounter for general adult medical examination without abnormal findings: Secondary | ICD-10-CM | POA: Diagnosis not present

## 2019-09-03 NOTE — Progress Notes (Addendum)
Subjective:   Kirk Anderson is a 73 y.o. male who presents for Medicare Annual/Subsequent preventive examination. I connected with patient by a telephone and verified that I am speaking with the correct person using two identifiers. Patient stated full name and DOB. Patient gave permission to continue with telephonic visit. Patient's location was at home and Nurse's location was at [facility] office.   Review of Systems:   Cardiac Risk Factors include: advanced age (>8455men, 26>65 women);dyslipidemia;male gender;hypertension Sleep patterns: feels rested on waking, gets up 1-2 times nightly to void and sleeps 7 hours nightly.    Home Safety/Smoke Alarms: Feels safe in home. Smoke alarms in place.  Living environment; residence and Firearm Safety: 2-story house.Lives with wife, no needs for DME, good support system Seat Belt Safety/Bike Helmet: Wears seat belt.     Objective:    Vitals: There were no vitals taken for this visit.  There is no height or weight on file to calculate BMI.  Advanced Directives 09/03/2019 08/28/2018 08/22/2017 01/04/2016 12/25/2013  Does Patient Have a Medical Advance Directive? Yes Yes Yes Yes Patient has advance directive, copy not in chart  Type of Advance Directive Healthcare Power of [place];Living will Healthcare Power of [place];Living will Healthcare Power of [place]orney;Living will - Living will  Copy of Healthcare Power of Attorney in Chart? No - copy requested No - copy requested No - copy requested - Copy requested from family  Pre-existing out of facility DNR order (yellow form or pink MOST form) - - - - No    Tobacco Social History   Tobacco Use  Smoking Status Former Smoker  . Packs/day: 2.00  . Years: 30.00  . Pack years: 60.00  . Types: Cigarettes  . Quit date: 12/19/1990  . Years since quitting: 28.7  Smokeless Tobacco Current User  . Types: Chew     Ready to quit: Not Answered Counseling given: Not Answered  Past Medical History:   Diagnosis Date  . ALLERGIC RHINITIS 05/26/2008  . COPD (chronic obstructive pulmonary disease) (HCC)    PFT's 03/24/05 FEV1 1.37 ratio 42, 21% improvement in FVC after B2, DLCO 80 -PFT's 06/29/09 1.19 ratio 29 6% better after B2, DLC0 73 -HFA 75% 06/29/2009 >90% 08/10/2009  . CORONARY ARTERY DISEASE 05/26/2008   minor by cath 2000  . EMPHYSEMA 10/29/2007  . GERD 05/26/2008  . Hyperlipidemia   . Impaired glucose tolerance 04/08/2014  . Multiple rib fractures 02/2000   Past Surgical History:  Procedure Laterality Date  . APPENDECTOMY    . HEMORRHOID SURGERY     Family History  Problem Relation Age of Onset  . Aneurysm Mother        Vacular Aneurysm  . Heart disease Father        CAD/Father had MI in his 6840's  . Alcohol abuse Brother   . Colon cancer Neg Hx    Social History   Socioeconomic History  . Marital status: Married    Spouse name: Not on file  . Number of children: 2  . Years of education: Not on file  . Highest education level: Not on file  Occupational History  . Occupation: retired  Engineer, productionocial Needs  . Financial resource strain: Not hard at all  . Food insecurity    Worry: Never true    Inability: Never true  . Transportation needs    Medical: No    Non-medical: No  Tobacco Use  . Smoking status: Former Smoker    Packs/day: 2.00  Years: 30.00    Pack years: 60.00    Types: Cigarettes    Quit date: 12/19/1990    Years since quitting: 28.7  . Smokeless tobacco: Current User    Types: Chew  Substance and Sexual Activity  . 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 Drug use: No  . Sexual activity: Yes  Lifestyle  . Physical activity    Days per week: 0 days    Minutes per session: 0 min  . Stress: Not at all  Relationships  . Social connections    Talks on phone: More than three times a week    Gets together: More than three times a week    Attends religious service: More than 4 times per  year    Active member of club or organization: Yes    Attends meetings of clubs or organizations: More than 4 times per year    Relationship status: Married  Other Topics Concern  . Not on file  Social History Narrative   Former Doctor, general practice   2 daughters    Outpatient Encounter Medications as of 09/03/2019  Medication Sig  . albuterol (PROAIR HFA) 108 (90 Base) MCG/ACT inhaler Inhale 2 puffs into the lungs every 6 (six) hours as needed for wheezing or shortness of breath.  Marland Kitchen aspirin 81 MG tablet Take 81 mg by mouth daily.    . budesonide-formoterol (SYMBICORT) 160-4.5 MCG/ACT inhaler INHALE 2 PUFFS INTO THE LUNGS 2 (TWO) TIMES DAILY.  Marland Kitchen Cetirizine HCl (ZYRTEC ALLERGY PO) Take by mouth every morning.  Marland Kitchen dextromethorphan-guaiFENesin (MUCINEX DM) 30-600 MG per 12 hr tablet Take 1 tablet by mouth 2 (two) times daily as needed for cough.  . famotidine (PEPCID) 20 MG tablet One at bedtime  . FISH OIL-KRILL OIL PO Take 2 tablets by mouth daily.  . Multiple Vitamin (MULTIVITAMIN) tablet Take 1 tablet by mouth daily.    . naproxen sodium (ANAPROX) 220 MG tablet Take 440 mg by mouth 2 (two) times daily as needed (for pain).   . pantoprazole (PROTONIX) 40 MG tablet Take 1 tablet (40 mg total) by mouth daily. Take 30-60 min before first meal of the day  . Tiotropium Bromide Monohydrate (SPIRIVA RESPIMAT) 2.5 MCG/ACT AERS USE 2 INHALATIONS EVERY DAY AS DIRECTED  . triamcinolone (NASACORT) 55 MCG/ACT AERO nasal inhaler Place 2 sprays into the nose daily.  Marland Kitchen triamcinolone cream (KENALOG) 0.1 % Apply 1 application topically 2 (two) times daily.   No facility-administered encounter medications on file as of 09/03/2019.     Activities of Daily Living In your present state of health, do you have any difficulty performing the following activities: 09/03/2019  Hearing? N  Vision? N  Difficulty concentrating or making decisions? N  Walking or climbing stairs? N  Dressing or bathing? N   Doing errands, shopping? N  Preparing Food and eating ? N  Using the Toilet? N  In the past six months, have you accidently leaked urine? N  Do you have problems with loss of bowel control? N  Managing your Medications? N  Managing your Finances? N  Housekeeping or managing your Housekeeping? N  Some recent data might be hidden    Patient Care Team: Biagio Borg, MD as PCP - General Tanda Rockers, MD as Consulting Physician (Pulmonary Disease)   Assessment:   This is a routine wellness examination for [place]. Physical assessment deferred to PCP.  Exercise Activities and Dietary recommendations Current Exercise Habits: Home exercise routine, Type of exercise: walking, Time (Minutes): 60, Frequency (Times/Week): 5, Weekly Exercise (Minutes/Week): 300, Intensity: Mild, Exercise limited by: None identified  Diet (meal preparation, eat out, water intake, caffeinated beverages, dairy products, fruits and vegetables): in general, a "healthy" diet  , well balanced.   Reviewed heart healthy and diabetic diet. Encouraged patient to increase daily water and healthy fluid intake.   Goals      Patient Stated   . patient (pt-stated)     Continue to active and fup with pulmonary      Other   . <enter goal here>    . maintain current health status     Stay active, eat healthy, enjoy life, family and church    . Patient Stated     Stay as healthy and as independent as possible.        Fall Risk Fall Risk  09/03/2019 02/12/2019 08/28/2018 03/07/2018 08/22/2017  Falls in the past year? 0 1 No No No  Number falls in past yr: 0 0 - - -  Injury with Fall? 0 0 - - -   Is the patient's home free of loose throw rugs in walkways, pet beds, electrical cords, etc?   yes      Grab bars in the bathroom? yes      Handrails on the stairs?   yes      Adequate lighting?   yes  Depression Screen PHQ 2/9 Scores 09/03/2019 02/12/2019 08/28/2018 03/07/2018  PHQ - 2 Score 0 0 0 0  PHQ- 9 Score - - - -     Cognitive Function MMSE - Mini Mental State Exam 08/22/2017  Orientation to time 5  Orientation to Place 5  Registration 3  Attention/ Calculation 5  Recall 2  Language- name 2 objects 2  Language- repeat 1  Language- follow 3 step command 3  Language- read & follow direction 1  Write a sentence 1  Copy design 1  Total score 29       Ad8 score reviewed for issues:  Issues making decisions: no  Less interest in hobbies / activities: no  Repeats questions, stories (family complaining): no  Trouble using ordinary gadgets (microwave, computer, phone):no  Forgets the month or year: no  Mismanaging finances: no  Remembering appts: no  Daily problems with thinking and/or memory: no Ad8 score is= 0  Immunization History  Administered Date(s) Administered  . Pneumococcal Conjugate-13 01/02/2014  . Pneumococcal Polysaccharide-23 02/12/2013  . Td 12/19/1993  . Tdap 02/12/2013   Screening Tests Health Maintenance  Topic Date Due  . INFLUENZA VACCINE  07/20/2019  . TETANUS/TDAP  02/12/2023  . COLONOSCOPY  04/12/2023  . Hepatitis C Screening  Completed  . PNA vac Low Risk Adult  Completed      Plan:    Reviewed health maintenance screenings with patient today and relevant education, vaccines, and/or referrals were provided.   Continue to eat heart healthy diet (full of fruits, vegetables, whole grains, lean protein, water--limit salt, fat, and sugar intake) and increase physical activity as tolerated.  Continue doing brain stimulating activities (puzzles, reading, adult coloring books, staying active) to keep memory sharp.   I have personally reviewed and noted the following in the patient's chart:   . Medical and social history . Use of alcohol, tobacco or illicit drugs  . Current medications and supplements . Functional ability and status . Nutritional status . Physical activity . Advanced  directives . List of other physicians . Screenings to include  cognitive, depression, and falls . Referrals and appointments  In addition, I have reviewed and discussed with patient certain preventive protocols, quality metrics, and best practice recommendations. A written personalized care plan for preventive services as well as general preventive health recommendations were provided to patient.     Wanda Plump, RN  09/03/2019  Medical screening examination/treatment/procedure(s) were performed by non-physician practitioner and as supervising physician I was immediately available for consultation/collaboration. I agree with above. Oliver Barre, MD

## 2019-12-16 ENCOUNTER — Other Ambulatory Visit: Payer: Self-pay | Admitting: Internal Medicine

## 2019-12-16 NOTE — Telephone Encounter (Signed)
meds done erx to [facility] x 3 mo only  Please to contact pt - due for ROV for further refills after this

## 2019-12-17 NOTE — Telephone Encounter (Signed)
Pt has appt scheduled for 02/14/20.Marland KitchenJohny Anderson

## 2020-02-14 ENCOUNTER — Ambulatory Visit: Payer: Medicare Other | Admitting: Internal Medicine

## 2020-03-17 ENCOUNTER — Other Ambulatory Visit: Payer: Self-pay | Admitting: Internal Medicine

## 2020-04-16 ENCOUNTER — Other Ambulatory Visit: Payer: Self-pay

## 2020-04-16 ENCOUNTER — Ambulatory Visit (INDEPENDENT_AMBULATORY_CARE_PROVIDER_SITE_OTHER): Payer: Medicare Other | Admitting: Internal Medicine

## 2020-04-16 VITALS — BP 148/72 | HR 76 | Temp 98.1°F | Ht 70.0 in | Wt 186.0 lb

## 2020-04-16 DIAGNOSIS — E530 Riboflavin deficiency: Secondary | ICD-10-CM | POA: Diagnosis not present

## 2020-04-16 DIAGNOSIS — R7302 Impaired glucose tolerance (oral): Secondary | ICD-10-CM | POA: Diagnosis not present

## 2020-04-16 DIAGNOSIS — Z0000 Encounter for general adult medical examination without abnormal findings: Secondary | ICD-10-CM | POA: Diagnosis not present

## 2020-04-16 DIAGNOSIS — I10 Essential (primary) hypertension: Secondary | ICD-10-CM | POA: Diagnosis not present

## 2020-04-16 DIAGNOSIS — E559 Vitamin D deficiency, unspecified: Secondary | ICD-10-CM | POA: Diagnosis not present

## 2020-04-16 DIAGNOSIS — J309 Allergic rhinitis, unspecified: Secondary | ICD-10-CM

## 2020-04-16 LAB — CBC WITH DIFFERENTIAL/PLATELET
Basophils Absolute: 0.1 10*3/uL (ref 0.0–0.1)
Basophils Relative: 0.8 % (ref 0.0–3.0)
Eosinophils Absolute: 0.4 10*3/uL (ref 0.0–0.7)
Eosinophils Relative: 5.3 % — ABNORMAL HIGH (ref 0.0–5.0)
HCT: 44 % (ref 39.0–52.0)
Hemoglobin: 14.7 g/dL (ref 13.0–17.0)
Lymphocytes Relative: 28.8 % (ref 12.0–46.0)
Lymphs Abs: 2.4 10*3/uL (ref 0.7–4.0)
MCHC: 33.4 g/dL (ref 30.0–36.0)
MCV: 89.5 fl (ref 78.0–100.0)
Monocytes Absolute: 0.6 10*3/uL (ref 0.1–1.0)
Monocytes Relative: 7.4 % (ref 3.0–12.0)
Neutro Abs: 4.8 10*3/uL (ref 1.4–7.7)
Neutrophils Relative %: 57.7 % (ref 43.0–77.0)
Platelets: 255 10*3/uL (ref 150.0–400.0)
RBC: 4.91 Mil/uL (ref 4.22–5.81)
RDW: 13.3 % (ref 11.5–15.5)
WBC: 8.4 10*3/uL (ref 4.0–10.5)

## 2020-04-16 LAB — URINALYSIS, ROUTINE W REFLEX MICROSCOPIC
Bilirubin Urine: NEGATIVE
Ketones, ur: NEGATIVE
Leukocytes,Ua: NEGATIVE
Nitrite: NEGATIVE
RBC / HPF: NONE SEEN (ref 0–?)
Specific Gravity, Urine: 1.01 (ref 1.000–1.030)
Total Protein, Urine: NEGATIVE
Urine Glucose: NEGATIVE
Urobilinogen, UA: 0.2 (ref 0.0–1.0)
pH: 7 (ref 5.0–8.0)

## 2020-04-16 LAB — TSH: TSH: 1.61 u[IU]/mL (ref 0.35–4.50)

## 2020-04-16 LAB — BASIC METABOLIC PANEL
BUN: 9 mg/dL (ref 6–23)
CO2: 31 mEq/L (ref 19–32)
Calcium: 9.3 mg/dL (ref 8.4–10.5)
Chloride: 100 mEq/L (ref 96–112)
Creatinine, Ser: 0.85 mg/dL (ref 0.40–1.50)
GFR: 88.08 mL/min (ref >60.00)
Glucose, Bld: 98 mg/dL (ref 70–99)
Potassium: 4.1 mEq/L (ref 3.5–5.1)
Sodium: 137 mEq/L (ref 135–145)

## 2020-04-16 LAB — HEPATIC FUNCTION PANEL
ALT: 15 U/L (ref 0–53)
AST: 19 U/L (ref 0–37)
Albumin: 4.4 g/dL (ref 3.5–5.2)
Alkaline Phosphatase: 66 U/L (ref 39–117)
Bilirubin, Direct: 0.1 mg/dL (ref 0.0–0.3)
Total Bilirubin: 0.8 mg/dL (ref 0.2–1.2)
Total Protein: 7 g/dL (ref 6.0–8.3)

## 2020-04-16 LAB — LIPID PANEL
Cholesterol: 197 mg/dL (ref 0–200)
HDL: 46.2 mg/dL (ref >39.00)
LDL Cholesterol: 112 mg/dL — ABNORMAL HIGH (ref 0–99)
NonHDL: 151.11
Total CHOL/HDL Ratio: 4
Triglycerides: 196 mg/dL — ABNORMAL HIGH (ref 0.0–149.0)
VLDL: 39.2 mg/dL (ref 0.0–40.0)

## 2020-04-16 LAB — VITAMIN B12: Vitamin B-12: 582 pg/mL (ref 211–911)

## 2020-04-16 LAB — PSA: PSA: 1.89 ng/mL (ref 0.10–4.00)

## 2020-04-16 LAB — VITAMIN D 25 HYDROXY (VIT D DEFICIENCY, FRACTURES): VITD: 40.85 ng/mL (ref 30.00–100.00)

## 2020-04-16 LAB — HEMOGLOBIN A1C: Hgb A1c MFr Bld: 5.8 % (ref 4.6–6.5)

## 2020-04-16 MED ORDER — TRIAMCINOLONE ACETONIDE 55 MCG/ACT NA AERO: 2.0000 | INHALATION_SPRAY | Freq: Every day | NASAL | 2 refills | Status: DC

## 2020-04-16 NOTE — Progress Notes (Signed)
Subjective:    Patient ID: Kirk Anderson, male    DOB: [birth year], 74 y.o.   MRN: [id number]  HPI  Here for wellness and f/u;  Overall doing ok;  Pt denies Chest pain, worsening SOB, DOE, wheezing, orthopnea, PND, worsening LE edema, palpitations, dizziness or syncope.  Pt denies neurological change such as new headache, facial or extremity weakness.  Pt denies polydipsia, polyuria, or low sugar symptoms. Pt states overall good compliance with treatment and medications, good tolerability, and has been trying to follow appropriate diet.  Pt denies worsening depressive symptoms, suicidal ideation or panic. No fever, night sweats, wt loss, loss of appetite, or other constitutional symptoms.  Pt states good ability with ADL's, has low fall risk, home safety reviewed and adequate, no other significant changes in hearing or vision, and only occasionally active with exercise. Does have several wks ongoing nasal allergy symptoms with clearish congestion, itch and sneezing, without fever, pain, ST, cough, swelling or wheezing. Wt Readings from Last 3 Encounters:  04/16/20 186 lb (84.4 kg)  02/12/19 187 lb (84.8 kg)  08/28/18 181 lb (82.1 kg)   Past Medical History:  Diagnosis Date  . ALLERGIC RHINITIS 05/26/2008  . COPD (chronic obstructive pulmonary disease) (HCC)    PFT's 03/24/05 FEV1 1.37 ratio 42, 21% improvement in FVC after B2, DLCO 80 -PFT's 06/29/09 1.19 ratio 29 6% better after B2, DLC0 73 -HFA 75% 06/29/2009 >90% 08/10/2009  . CORONARY ARTERY DISEASE 05/26/2008   minor by cath 2000  . EMPHYSEMA 10/29/2007  . GERD 05/26/2008  . Hyperlipidemia   . Impaired glucose tolerance 04/08/2014  . Multiple rib fractures 02/2000   Past Surgical History:  Procedure Laterality Date  . APPENDECTOMY    . HEMORRHOID SURGERY      reports that he quit smoking about 29 years ago. His smoking use included cigarettes. He has a 60.00 pack-year smoking history. His smokeless tobacco use includes chew. He reports  that he does not drink alcohol or use drugs. family history includes Alcohol abuse in his brother; Aneurysm in his mother; Heart disease in his father. Allergies  Allergen Reactions  . Statins Other (See Comments)    weakness  . Sudafed [Pseudoephedrine Hcl] Other (See Comments)    Urinary retention   Current Outpatient Medications on File Prior to Visit  Medication Sig Dispense Refill  . albuterol (PROAIR HFA) 108 (90 Base) MCG/ACT inhaler Inhale 2 puffs into the lungs every 6 (six) hours as needed for wheezing or shortness of breath. 25.5 g 2  . aspirin 81 MG tablet Take 81 mg by mouth daily.      . budesonide-formoterol (SYMBICORT) 160-4.5 MCG/ACT inhaler USE 2 INHALATIONS BY MOUTH  TWICE DAILY 30.6 g 0  . Cetirizine HCl (ZYRTEC ALLERGY PO) Take by mouth every morning.    Marland Kitchen dextromethorphan-guaiFENesin (MUCINEX DM) 30-600 MG per 12 hr tablet Take 1 tablet by mouth 2 (two) times daily as needed for cough.    . famotidine (PEPCID) 20 MG tablet One at bedtime 30 tablet 11  . FISH OIL-KRILL OIL PO Take 2 tablets by mouth daily.    . Multiple Vitamin (MULTIVITAMIN) tablet Take 1 tablet by mouth daily.      . naproxen sodium (ANAPROX) 220 MG tablet Take 440 mg by mouth 2 (two) times daily as needed (for pain).     . pantoprazole (PROTONIX) 40 MG tablet Take 1 tablet (40 mg total) by mouth daily. Take 30-60 min before first meal of the day  30 tablet 2  . SPIRIVA RESPIMAT 2.5 MCG/ACT AERS USE 2 INHALATIONS BY MOUTH  EVERY DAY AS DIRECTED 12 g 0   No current facility-administered medications on file prior to visit.   Review of Systems All otherwise neg per pt     Objective:   Physical Exam BP (!) 148/72 (BP Location: Left Arm, Patient Position: Sitting, Cuff Size: Normal)   Pulse 76   Temp 98.1 F (36.7 C) (Oral)   Ht 5\' 10"  (1.778 m)   Wt 186 lb (84.4 kg)   SpO2 90%   BMI 26.69 kg/m  VS noted,  Constitutional: Pt appears in NAD HENT: Head: NCAT.  Right Ear: External ear normal.    Left Ear: External ear normal.  Eyes: . Pupils are equal, round, and reactive to light. Conjunctivae and EOM are normal Nose: without d/c or deformity Neck: Neck supple. Gross normal ROM Cardiovascular: Normal rate and regular rhythm.   Pulmonary/Chest: Effort normal and breath sounds without rales or wheezing.  Abd:  Soft, NT, ND, + BS, no organomegaly Neurological: Pt is alert. At baseline orientation, motor grossly intact Skin: Skin is warm. No rashes, other new lesions, no LE edema Psychiatric: Pt behavior is normal without agitation  All otherwise neg per pt  Lab Results  Component Value Date   WBC 8.4 04/16/2020   HGB 14.7 04/16/2020   HCT 44.0 04/16/2020   PLT 255.0 04/16/2020   GLUCOSE 98 04/16/2020   CHOL 197 04/16/2020   TRIG 196.0 (H) 04/16/2020   HDL 46.20 04/16/2020   LDLDIRECT 149.0 02/12/2019   LDLCALC 112 (H) 04/16/2020   ALT 15 04/16/2020   AST 19 04/16/2020   NA 137 04/16/2020   K 4.1 04/16/2020   CL 100 04/16/2020   CREATININE 0.85 04/16/2020   BUN 9 04/16/2020   CO2 31 04/16/2020   TSH 1.61 04/16/2020   PSA 1.89 04/16/2020   HGBA1C 5.8 04/16/2020      Assessment & Plan:

## 2020-04-16 NOTE — Patient Instructions (Signed)
Please check your Blood Pressure on a regular basis at home, with the goal being to be less than 140/90  Please take all new medication as prescribed - the nasacort for allergies  Please continue all other medications as before, and refills have been done if requested.  Please have the pharmacy call with any other refills you may need.  Please continue your efforts at being more active, low cholesterol diet, and weight control.  You are otherwise up to date with prevention measures today.  Please keep your appointments with your specialists as you may have planned  .Please go to the LAB at the blood drawing area for the tests to be done  You will be contacted by phone if any changes need to be made immediately.  Otherwise, you will receive a letter about your results with an explanation, but please check with MyChart first.  Please remember to sign up for MyChart if you have not done so, as this will be important to you in the future with finding out test results, communicating by private email, and scheduling acute appointments online when needed.  Please make an Appointment to return in 6 months, or sooner if needed

## 2020-04-17 ENCOUNTER — Other Ambulatory Visit: Payer: Self-pay | Admitting: Internal Medicine

## 2020-04-17 MED ORDER — EZETIMIBE 10 MG PO TABS
10.0000 mg | ORAL_TABLET | Freq: Every day | ORAL | 3 refills | Status: DC
Start: 2020-04-17 — End: 2020-06-09

## 2020-04-18 ENCOUNTER — Encounter: Payer: Self-pay | Admitting: Internal Medicine

## 2020-04-18 NOTE — Assessment & Plan Note (Signed)
Uncontrolled, declines med change today, for BP check at home daily for 2 wks and call with average

## 2020-04-18 NOTE — Assessment & Plan Note (Signed)
For nasacort asd,  to f/u any worsening symptoms or concerns °

## 2020-04-18 NOTE — Assessment & Plan Note (Signed)

## 2020-04-18 NOTE — Assessment & Plan Note (Signed)
stable overall by history and exam, recent data reviewed with pt, and pt to continue medical treatment as before,  to f/u any worsening symptoms or concerns  

## 2020-04-20 ENCOUNTER — Telehealth: Payer: Self-pay

## 2020-04-20 MED ORDER — TRIAMCINOLONE ACETONIDE 0.1 % EX CREA: 1.0000 "application " | TOPICAL_CREAM | Freq: Two times a day (BID) | CUTANEOUS | 1 refills | Status: DC

## 2020-04-20 NOTE — Telephone Encounter (Signed)
New message   Seen on  4.29.21  Pharmacy calling stating patient was waiting on cream to be called in by Dr. Jonny Ruiz.    Please advise.

## 2020-04-20 NOTE — Telephone Encounter (Signed)
Done erx 

## 2020-04-29 ENCOUNTER — Other Ambulatory Visit: Payer: Self-pay | Admitting: Internal Medicine

## 2020-05-28 ENCOUNTER — Other Ambulatory Visit: Payer: Self-pay | Admitting: Internal Medicine

## 2020-05-28 NOTE — Telephone Encounter (Signed)
Please refill as per office routine med refill policy (all routine meds refilled for 3 mo or monthly per pt preference up to one year from last visit, then month to month grace period for 3 mo, then further med refills will have to be denied)  

## 2020-06-08 ENCOUNTER — Telehealth: Payer: Self-pay | Admitting: Internal Medicine

## 2020-06-08 NOTE — Telephone Encounter (Signed)
    Patient calling to clarify budesonide-formoterol (SYMBICORT) 80-4.5 MCG/ACT inhaler dosage

## 2020-06-09 ENCOUNTER — Other Ambulatory Visit: Payer: Self-pay

## 2020-06-09 MED ORDER — EZETIMIBE 10 MG PO TABS: 10.0000 mg | ORAL_TABLET | Freq: Every day | ORAL | 3 refills | Status: AC

## 2020-06-09 MED ORDER — BUDESONIDE-FORMOTEROL FUMARATE 160-4.5 MCG/ACT IN AERO: 2.0000 | INHALATION_SPRAY | Freq: Two times a day (BID) | RESPIRATORY_TRACT | 3 refills | Status: AC

## 2020-06-09 NOTE — Telephone Encounter (Signed)
F/u   The patient following up on Monday messages, asking for a call back today.

## 2020-06-09 NOTE — Telephone Encounter (Signed)
Spoke with the pt and we were able to discuss his medication concerns. I was able to explain the confusion with the pt about his meds. Pt understood and has no questions or concerns at this time.

## 2020-07-06 ENCOUNTER — Other Ambulatory Visit: Payer: Self-pay

## 2020-07-06 MED ORDER — TRIAMCINOLONE ACETONIDE 55 MCG/ACT NA AERO: 2.0000 | INHALATION_SPRAY | Freq: Every day | NASAL | 2 refills | Status: AC

## 2020-07-25 DIAGNOSIS — Z20822 Contact with and (suspected) exposure to covid-19: Secondary | ICD-10-CM | POA: Diagnosis not present

## 2020-07-25 DIAGNOSIS — J441 Chronic obstructive pulmonary disease with (acute) exacerbation: Secondary | ICD-10-CM | POA: Diagnosis not present

## 2020-07-25 DIAGNOSIS — I071 Rheumatic tricuspid insufficiency: Secondary | ICD-10-CM | POA: Diagnosis not present

## 2020-07-25 DIAGNOSIS — J432 Centrilobular emphysema: Secondary | ICD-10-CM | POA: Diagnosis not present

## 2020-07-25 DIAGNOSIS — I4892 Unspecified atrial flutter: Secondary | ICD-10-CM | POA: Diagnosis not present

## 2020-07-25 DIAGNOSIS — J449 Chronic obstructive pulmonary disease, unspecified: Secondary | ICD-10-CM | POA: Diagnosis not present

## 2020-07-25 DIAGNOSIS — Z03818 Encounter for observation for suspected exposure to other biological agents ruled out: Secondary | ICD-10-CM | POA: Diagnosis not present

## 2020-07-25 DIAGNOSIS — R911 Solitary pulmonary nodule: Secondary | ICD-10-CM | POA: Diagnosis not present

## 2020-07-25 DIAGNOSIS — R079 Chest pain, unspecified: Secondary | ICD-10-CM | POA: Diagnosis not present

## 2020-07-25 DIAGNOSIS — R000 Tachycardia, unspecified: Secondary | ICD-10-CM | POA: Diagnosis not present

## 2020-07-25 DIAGNOSIS — R0902 Hypoxemia: Secondary | ICD-10-CM | POA: Diagnosis not present

## 2020-07-25 DIAGNOSIS — R918 Other nonspecific abnormal finding of lung field: Secondary | ICD-10-CM | POA: Diagnosis not present

## 2020-07-31 DIAGNOSIS — I4891 Unspecified atrial fibrillation: Secondary | ICD-10-CM | POA: Diagnosis not present

## 2020-07-31 DIAGNOSIS — R300 Dysuria: Secondary | ICD-10-CM | POA: Diagnosis not present

## 2020-07-31 DIAGNOSIS — R918 Other nonspecific abnormal finding of lung field: Secondary | ICD-10-CM | POA: Diagnosis not present

## 2020-07-31 DIAGNOSIS — J449 Chronic obstructive pulmonary disease, unspecified: Secondary | ICD-10-CM | POA: Diagnosis not present

## 2020-08-04 DIAGNOSIS — R000 Tachycardia, unspecified: Secondary | ICD-10-CM | POA: Diagnosis not present

## 2020-08-04 DIAGNOSIS — J449 Chronic obstructive pulmonary disease, unspecified: Secondary | ICD-10-CM | POA: Diagnosis not present

## 2020-08-04 DIAGNOSIS — R002 Palpitations: Secondary | ICD-10-CM | POA: Diagnosis not present

## 2020-08-04 DIAGNOSIS — I10 Essential (primary) hypertension: Secondary | ICD-10-CM | POA: Diagnosis not present

## 2020-08-19 DIAGNOSIS — R002 Palpitations: Secondary | ICD-10-CM | POA: Diagnosis not present

## 2020-08-26 DIAGNOSIS — J449 Chronic obstructive pulmonary disease, unspecified: Secondary | ICD-10-CM | POA: Diagnosis not present

## 2020-09-04 DIAGNOSIS — R918 Other nonspecific abnormal finding of lung field: Secondary | ICD-10-CM | POA: Diagnosis not present

## 2020-09-04 DIAGNOSIS — J449 Chronic obstructive pulmonary disease, unspecified: Secondary | ICD-10-CM | POA: Diagnosis not present

## 2020-09-08 DIAGNOSIS — I10 Essential (primary) hypertension: Secondary | ICD-10-CM | POA: Diagnosis not present

## 2020-09-08 DIAGNOSIS — R000 Tachycardia, unspecified: Secondary | ICD-10-CM | POA: Diagnosis not present

## 2020-09-08 DIAGNOSIS — J449 Chronic obstructive pulmonary disease, unspecified: Secondary | ICD-10-CM | POA: Diagnosis not present

## 2020-09-09 ENCOUNTER — Ambulatory Visit: Payer: Self-pay

## 2020-09-25 DIAGNOSIS — J449 Chronic obstructive pulmonary disease, unspecified: Secondary | ICD-10-CM | POA: Diagnosis not present

## 2020-10-23 DIAGNOSIS — Z20822 Contact with and (suspected) exposure to covid-19: Secondary | ICD-10-CM | POA: Diagnosis not present

## 2020-10-26 DIAGNOSIS — J449 Chronic obstructive pulmonary disease, unspecified: Secondary | ICD-10-CM | POA: Diagnosis not present

## 2020-10-26 DIAGNOSIS — R918 Other nonspecific abnormal finding of lung field: Secondary | ICD-10-CM | POA: Diagnosis not present

## 2020-10-28 DIAGNOSIS — R918 Other nonspecific abnormal finding of lung field: Secondary | ICD-10-CM | POA: Diagnosis not present

## 2020-10-28 DIAGNOSIS — J449 Chronic obstructive pulmonary disease, unspecified: Secondary | ICD-10-CM | POA: Diagnosis not present

## 2020-11-25 DIAGNOSIS — J449 Chronic obstructive pulmonary disease, unspecified: Secondary | ICD-10-CM | POA: Diagnosis not present

## 2020-11-27 DIAGNOSIS — U071 COVID-19: Secondary | ICD-10-CM | POA: Diagnosis not present

## 2020-12-26 DIAGNOSIS — J449 Chronic obstructive pulmonary disease, unspecified: Secondary | ICD-10-CM | POA: Diagnosis not present

## 2020-12-28 ENCOUNTER — Telehealth: Payer: Self-pay | Admitting: Internal Medicine

## 2020-12-28 NOTE — Telephone Encounter (Signed)
LVM for pt to rtn my call to schedule awv with nha. Please schedule appt if pt calls the office.  

## 2021-01-26 DIAGNOSIS — J449 Chronic obstructive pulmonary disease, unspecified: Secondary | ICD-10-CM | POA: Diagnosis not present

## 2021-01-29 DIAGNOSIS — I4891 Unspecified atrial fibrillation: Secondary | ICD-10-CM | POA: Diagnosis not present

## 2021-01-29 DIAGNOSIS — J449 Chronic obstructive pulmonary disease, unspecified: Secondary | ICD-10-CM | POA: Diagnosis not present

## 2021-01-29 DIAGNOSIS — R918 Other nonspecific abnormal finding of lung field: Secondary | ICD-10-CM | POA: Diagnosis not present

## 2021-01-29 DIAGNOSIS — Z136 Encounter for screening for cardiovascular disorders: Secondary | ICD-10-CM | POA: Diagnosis not present

## 2021-02-05 DIAGNOSIS — R918 Other nonspecific abnormal finding of lung field: Secondary | ICD-10-CM | POA: Diagnosis not present

## 2021-02-05 DIAGNOSIS — E7800 Pure hypercholesterolemia, unspecified: Secondary | ICD-10-CM | POA: Diagnosis not present

## 2021-02-05 DIAGNOSIS — Z23 Encounter for immunization: Secondary | ICD-10-CM | POA: Diagnosis not present

## 2021-02-05 DIAGNOSIS — J449 Chronic obstructive pulmonary disease, unspecified: Secondary | ICD-10-CM | POA: Diagnosis not present

## 2021-02-05 DIAGNOSIS — Z1389 Encounter for screening for other disorder: Secondary | ICD-10-CM | POA: Diagnosis not present

## 2021-02-05 DIAGNOSIS — Z0000 Encounter for general adult medical examination without abnormal findings: Secondary | ICD-10-CM | POA: Diagnosis not present

## 2021-02-23 DIAGNOSIS — J449 Chronic obstructive pulmonary disease, unspecified: Secondary | ICD-10-CM | POA: Diagnosis not present

## 2021-02-26 DIAGNOSIS — J449 Chronic obstructive pulmonary disease, unspecified: Secondary | ICD-10-CM | POA: Diagnosis not present

## 2021-02-26 DIAGNOSIS — K219 Gastro-esophageal reflux disease without esophagitis: Secondary | ICD-10-CM | POA: Diagnosis not present

## 2021-02-26 DIAGNOSIS — R0902 Hypoxemia: Secondary | ICD-10-CM | POA: Diagnosis not present

## 2021-02-26 DIAGNOSIS — R0602 Shortness of breath: Secondary | ICD-10-CM | POA: Diagnosis not present

## 2021-02-26 DIAGNOSIS — E785 Hyperlipidemia, unspecified: Secondary | ICD-10-CM | POA: Diagnosis not present

## 2021-02-26 DIAGNOSIS — Z87891 Personal history of nicotine dependence: Secondary | ICD-10-CM | POA: Diagnosis not present

## 2021-03-02 DIAGNOSIS — J449 Chronic obstructive pulmonary disease, unspecified: Secondary | ICD-10-CM | POA: Diagnosis not present

## 2021-03-02 DIAGNOSIS — Z87891 Personal history of nicotine dependence: Secondary | ICD-10-CM | POA: Diagnosis not present

## 2021-03-02 DIAGNOSIS — R0602 Shortness of breath: Secondary | ICD-10-CM | POA: Diagnosis not present

## 2021-03-02 DIAGNOSIS — E785 Hyperlipidemia, unspecified: Secondary | ICD-10-CM | POA: Diagnosis not present

## 2021-03-02 DIAGNOSIS — K219 Gastro-esophageal reflux disease without esophagitis: Secondary | ICD-10-CM | POA: Diagnosis not present

## 2021-03-02 DIAGNOSIS — R0902 Hypoxemia: Secondary | ICD-10-CM | POA: Diagnosis not present

## 2021-03-04 DIAGNOSIS — R0902 Hypoxemia: Secondary | ICD-10-CM | POA: Diagnosis not present

## 2021-03-04 DIAGNOSIS — J449 Chronic obstructive pulmonary disease, unspecified: Secondary | ICD-10-CM | POA: Diagnosis not present

## 2021-03-04 DIAGNOSIS — E785 Hyperlipidemia, unspecified: Secondary | ICD-10-CM | POA: Diagnosis not present

## 2021-03-04 DIAGNOSIS — Z87891 Personal history of nicotine dependence: Secondary | ICD-10-CM | POA: Diagnosis not present

## 2021-03-04 DIAGNOSIS — R0602 Shortness of breath: Secondary | ICD-10-CM | POA: Diagnosis not present

## 2021-03-04 DIAGNOSIS — K219 Gastro-esophageal reflux disease without esophagitis: Secondary | ICD-10-CM | POA: Diagnosis not present

## 2021-03-09 DIAGNOSIS — K219 Gastro-esophageal reflux disease without esophagitis: Secondary | ICD-10-CM | POA: Diagnosis not present

## 2021-03-09 DIAGNOSIS — J449 Chronic obstructive pulmonary disease, unspecified: Secondary | ICD-10-CM | POA: Diagnosis not present

## 2021-03-09 DIAGNOSIS — Z87891 Personal history of nicotine dependence: Secondary | ICD-10-CM | POA: Diagnosis not present

## 2021-03-09 DIAGNOSIS — E785 Hyperlipidemia, unspecified: Secondary | ICD-10-CM | POA: Diagnosis not present

## 2021-03-09 DIAGNOSIS — R0602 Shortness of breath: Secondary | ICD-10-CM | POA: Diagnosis not present

## 2021-03-09 DIAGNOSIS — R0902 Hypoxemia: Secondary | ICD-10-CM | POA: Diagnosis not present

## 2021-03-11 DIAGNOSIS — Z87891 Personal history of nicotine dependence: Secondary | ICD-10-CM | POA: Diagnosis not present

## 2021-03-11 DIAGNOSIS — K219 Gastro-esophageal reflux disease without esophagitis: Secondary | ICD-10-CM | POA: Diagnosis not present

## 2021-03-11 DIAGNOSIS — R0902 Hypoxemia: Secondary | ICD-10-CM | POA: Diagnosis not present

## 2021-03-11 DIAGNOSIS — R0602 Shortness of breath: Secondary | ICD-10-CM | POA: Diagnosis not present

## 2021-03-11 DIAGNOSIS — J449 Chronic obstructive pulmonary disease, unspecified: Secondary | ICD-10-CM | POA: Diagnosis not present

## 2021-03-11 DIAGNOSIS — E785 Hyperlipidemia, unspecified: Secondary | ICD-10-CM | POA: Diagnosis not present

## 2021-03-16 DIAGNOSIS — J449 Chronic obstructive pulmonary disease, unspecified: Secondary | ICD-10-CM | POA: Diagnosis not present

## 2021-03-16 DIAGNOSIS — Z87891 Personal history of nicotine dependence: Secondary | ICD-10-CM | POA: Diagnosis not present

## 2021-03-16 DIAGNOSIS — R0602 Shortness of breath: Secondary | ICD-10-CM | POA: Diagnosis not present

## 2021-03-16 DIAGNOSIS — R0902 Hypoxemia: Secondary | ICD-10-CM | POA: Diagnosis not present

## 2021-03-16 DIAGNOSIS — K219 Gastro-esophageal reflux disease without esophagitis: Secondary | ICD-10-CM | POA: Diagnosis not present

## 2021-03-16 DIAGNOSIS — E785 Hyperlipidemia, unspecified: Secondary | ICD-10-CM | POA: Diagnosis not present

## 2021-03-18 DIAGNOSIS — E785 Hyperlipidemia, unspecified: Secondary | ICD-10-CM | POA: Diagnosis not present

## 2021-03-18 DIAGNOSIS — R0902 Hypoxemia: Secondary | ICD-10-CM | POA: Diagnosis not present

## 2021-03-18 DIAGNOSIS — Z87891 Personal history of nicotine dependence: Secondary | ICD-10-CM | POA: Diagnosis not present

## 2021-03-18 DIAGNOSIS — K219 Gastro-esophageal reflux disease without esophagitis: Secondary | ICD-10-CM | POA: Diagnosis not present

## 2021-03-18 DIAGNOSIS — J449 Chronic obstructive pulmonary disease, unspecified: Secondary | ICD-10-CM | POA: Diagnosis not present

## 2021-03-18 DIAGNOSIS — R0602 Shortness of breath: Secondary | ICD-10-CM | POA: Diagnosis not present

## 2021-03-26 DIAGNOSIS — J449 Chronic obstructive pulmonary disease, unspecified: Secondary | ICD-10-CM | POA: Diagnosis not present

## 2021-04-25 DIAGNOSIS — J449 Chronic obstructive pulmonary disease, unspecified: Secondary | ICD-10-CM | POA: Diagnosis not present

## 2021-04-30 DIAGNOSIS — F17290 Nicotine dependence, other tobacco product, uncomplicated: Secondary | ICD-10-CM | POA: Diagnosis not present

## 2021-04-30 DIAGNOSIS — J449 Chronic obstructive pulmonary disease, unspecified: Secondary | ICD-10-CM | POA: Diagnosis not present

## 2021-04-30 DIAGNOSIS — H6122 Impacted cerumen, left ear: Secondary | ICD-10-CM | POA: Diagnosis not present

## 2021-05-26 DIAGNOSIS — J449 Chronic obstructive pulmonary disease, unspecified: Secondary | ICD-10-CM | POA: Diagnosis not present

## 2021-06-25 DIAGNOSIS — J449 Chronic obstructive pulmonary disease, unspecified: Secondary | ICD-10-CM | POA: Diagnosis not present

## 2021-07-09 DIAGNOSIS — S51811A Laceration without foreign body of right forearm, initial encounter: Secondary | ICD-10-CM | POA: Diagnosis not present

## 2021-07-09 DIAGNOSIS — F17220 Nicotine dependence, chewing tobacco, uncomplicated: Secondary | ICD-10-CM | POA: Diagnosis not present

## 2021-07-09 DIAGNOSIS — S6401XA Injury of ulnar nerve at wrist and hand level of right arm, initial encounter: Secondary | ICD-10-CM | POA: Diagnosis not present

## 2021-07-09 DIAGNOSIS — S61511A Laceration without foreign body of right wrist, initial encounter: Secondary | ICD-10-CM | POA: Diagnosis not present

## 2021-07-09 DIAGNOSIS — S41111A Laceration without foreign body of right upper arm, initial encounter: Secondary | ICD-10-CM | POA: Diagnosis not present

## 2021-07-09 DIAGNOSIS — Z79899 Other long term (current) drug therapy: Secondary | ICD-10-CM | POA: Diagnosis not present

## 2021-07-14 ENCOUNTER — Ambulatory Visit: Payer: Medicare Other | Admitting: Orthopaedic Surgery

## 2021-07-14 ENCOUNTER — Encounter: Payer: Self-pay | Admitting: Orthopaedic Surgery

## 2021-07-14 DIAGNOSIS — S41111D Laceration without foreign body of right upper arm, subsequent encounter: Secondary | ICD-10-CM | POA: Diagnosis not present

## 2021-07-14 NOTE — Progress Notes (Signed)
Mr. Kirk Anderson comes in today for evaluation treatment of an acute laceration to his right volar/ulnar forearm.  This happened about 5 days ago.  He was seen at outlying emergency room and had a laceration repaired.  His daughter is Maud Deed a former Advice worker here.  Velna Hatchet sent me a picture of the lacerations.  He had significant swelling but no deficits in terms of the tendons or nerves or artery.  She wanted me to see him in follow-up which I think is reasonable.  He is right-hand dominant.  He states that he is moving his fingers and thumb better and using his hand better overall.  He denies any numbness and tingling on the ulnar aspect of his right hand.  He denies any other acute change in medical status.  He denies any fever, chills, nausea, vomiting.  Examination of his right forearm shows extensive bruising and swelling.  There is no evidence of compartment syndrome.  The soft tissue is soft.  He has a palpable ulnar and radial pulse.  His fingers are all well perfused on the right side.  His sutures are intact from a small laceration on the volar ulnar aspect of the forearm.  There is no evidence of infection.  He is able to abduct and abduct his fingers as well as flex and extend his fingers especially the ring and little finger.  He does have weak grip strength though.  He says he is improved dramatically over the last 2 days.  Fortunately does not look like this laceration that occurred with a cut from glass that was deep cause any significant injury.  I want him to continue to use his hand as comfort allows.  He can protect the suture with a Band-Aid and should have the sutures removed in about a week from now.  All questions and concerns were answered and addressed.

## 2023-04-19 ENCOUNTER — Encounter: Payer: Self-pay | Admitting: Internal Medicine
